# Patient Record
Sex: Female | Born: 1960 | Race: White | Hispanic: No | Marital: Married | Smoking: Former smoker
Health system: Southern US, Community
[De-identification: ages and names within clinical notes are randomized; demographics above are authoritative.]

## PROBLEM LIST (undated history)

## (undated) DIAGNOSIS — M810 Age-related osteoporosis without current pathological fracture: Secondary | ICD-10-CM

## (undated) DIAGNOSIS — E785 Hyperlipidemia, unspecified: Secondary | ICD-10-CM

## (undated) DIAGNOSIS — I447 Left bundle-branch block, unspecified: Secondary | ICD-10-CM

## (undated) HISTORY — DX: Left bundle-branch block, unspecified: I44.7

## (undated) HISTORY — PX: OTHER SURGICAL HISTORY: SHX169

---

## 2001-06-05 ENCOUNTER — Other Ambulatory Visit: Admission: RE | Admit: 2001-06-05 | Discharge: 2001-06-05 | Payer: Self-pay | Admitting: Obstetrics and Gynecology

## 2002-07-15 ENCOUNTER — Other Ambulatory Visit: Admission: RE | Admit: 2002-07-15 | Discharge: 2002-07-15 | Payer: Self-pay | Admitting: Obstetrics and Gynecology

## 2003-08-14 ENCOUNTER — Other Ambulatory Visit: Admission: RE | Admit: 2003-08-14 | Discharge: 2003-08-14 | Payer: Self-pay | Admitting: Obstetrics and Gynecology

## 2004-08-16 ENCOUNTER — Other Ambulatory Visit: Admission: RE | Admit: 2004-08-16 | Discharge: 2004-08-16 | Payer: Self-pay | Admitting: Obstetrics and Gynecology

## 2015-09-29 DIAGNOSIS — R102 Pelvic and perineal pain: Secondary | ICD-10-CM | POA: Diagnosis not present

## 2015-09-29 DIAGNOSIS — N941 Unspecified dyspareunia: Secondary | ICD-10-CM | POA: Diagnosis not present

## 2015-10-11 DIAGNOSIS — Z0000 Encounter for general adult medical examination without abnormal findings: Secondary | ICD-10-CM | POA: Diagnosis not present

## 2015-10-11 DIAGNOSIS — Z8249 Family history of ischemic heart disease and other diseases of the circulatory system: Secondary | ICD-10-CM | POA: Diagnosis not present

## 2015-10-11 DIAGNOSIS — N390 Urinary tract infection, site not specified: Secondary | ICD-10-CM | POA: Diagnosis not present

## 2015-10-11 DIAGNOSIS — F438 Other reactions to severe stress: Secondary | ICD-10-CM | POA: Diagnosis not present

## 2015-10-11 DIAGNOSIS — Z23 Encounter for immunization: Secondary | ICD-10-CM | POA: Diagnosis not present

## 2015-10-11 DIAGNOSIS — Z1389 Encounter for screening for other disorder: Secondary | ICD-10-CM | POA: Diagnosis not present

## 2015-10-11 DIAGNOSIS — N3010 Interstitial cystitis (chronic) without hematuria: Secondary | ICD-10-CM | POA: Diagnosis not present

## 2015-10-11 DIAGNOSIS — N200 Calculus of kidney: Secondary | ICD-10-CM | POA: Diagnosis not present

## 2015-10-11 DIAGNOSIS — R8299 Other abnormal findings in urine: Secondary | ICD-10-CM | POA: Diagnosis not present

## 2016-06-08 DIAGNOSIS — Z6824 Body mass index (BMI) 24.0-24.9, adult: Secondary | ICD-10-CM | POA: Diagnosis not present

## 2016-06-08 DIAGNOSIS — Z01419 Encounter for gynecological examination (general) (routine) without abnormal findings: Secondary | ICD-10-CM | POA: Diagnosis not present

## 2016-07-05 DIAGNOSIS — Z1231 Encounter for screening mammogram for malignant neoplasm of breast: Secondary | ICD-10-CM | POA: Diagnosis not present

## 2016-10-25 DIAGNOSIS — E784 Other hyperlipidemia: Secondary | ICD-10-CM | POA: Diagnosis not present

## 2016-10-31 DIAGNOSIS — Z0000 Encounter for general adult medical examination without abnormal findings: Secondary | ICD-10-CM | POA: Diagnosis not present

## 2016-10-31 DIAGNOSIS — Z1389 Encounter for screening for other disorder: Secondary | ICD-10-CM | POA: Diagnosis not present

## 2016-10-31 DIAGNOSIS — E784 Other hyperlipidemia: Secondary | ICD-10-CM | POA: Diagnosis not present

## 2016-10-31 DIAGNOSIS — F438 Other reactions to severe stress: Secondary | ICD-10-CM | POA: Diagnosis not present

## 2016-10-31 DIAGNOSIS — Z23 Encounter for immunization: Secondary | ICD-10-CM | POA: Diagnosis not present

## 2016-10-31 DIAGNOSIS — Z8249 Family history of ischemic heart disease and other diseases of the circulatory system: Secondary | ICD-10-CM | POA: Diagnosis not present

## 2016-10-31 DIAGNOSIS — N200 Calculus of kidney: Secondary | ICD-10-CM | POA: Diagnosis not present

## 2016-11-01 DIAGNOSIS — Z1212 Encounter for screening for malignant neoplasm of rectum: Secondary | ICD-10-CM | POA: Diagnosis not present

## 2017-04-30 DIAGNOSIS — H524 Presbyopia: Secondary | ICD-10-CM | POA: Diagnosis not present

## 2017-04-30 DIAGNOSIS — H52202 Unspecified astigmatism, left eye: Secondary | ICD-10-CM | POA: Diagnosis not present

## 2017-05-10 DIAGNOSIS — N390 Urinary tract infection, site not specified: Secondary | ICD-10-CM | POA: Diagnosis not present

## 2017-05-10 DIAGNOSIS — R82998 Other abnormal findings in urine: Secondary | ICD-10-CM | POA: Diagnosis not present

## 2017-07-16 DIAGNOSIS — Z1231 Encounter for screening mammogram for malignant neoplasm of breast: Secondary | ICD-10-CM | POA: Diagnosis not present

## 2017-07-16 DIAGNOSIS — Z6824 Body mass index (BMI) 24.0-24.9, adult: Secondary | ICD-10-CM | POA: Diagnosis not present

## 2017-07-16 DIAGNOSIS — Z01419 Encounter for gynecological examination (general) (routine) without abnormal findings: Secondary | ICD-10-CM | POA: Diagnosis not present

## 2017-07-16 DIAGNOSIS — Z23 Encounter for immunization: Secondary | ICD-10-CM | POA: Diagnosis not present

## 2017-08-02 DIAGNOSIS — Z13820 Encounter for screening for osteoporosis: Secondary | ICD-10-CM | POA: Diagnosis not present

## 2017-08-14 DIAGNOSIS — M818 Other osteoporosis without current pathological fracture: Secondary | ICD-10-CM | POA: Diagnosis not present

## 2017-12-05 DIAGNOSIS — Z0000 Encounter for general adult medical examination without abnormal findings: Secondary | ICD-10-CM | POA: Diagnosis not present

## 2017-12-05 DIAGNOSIS — E7849 Other hyperlipidemia: Secondary | ICD-10-CM | POA: Diagnosis not present

## 2017-12-13 DIAGNOSIS — E7849 Other hyperlipidemia: Secondary | ICD-10-CM | POA: Diagnosis not present

## 2017-12-13 DIAGNOSIS — Z8249 Family history of ischemic heart disease and other diseases of the circulatory system: Secondary | ICD-10-CM | POA: Diagnosis not present

## 2017-12-13 DIAGNOSIS — N200 Calculus of kidney: Secondary | ICD-10-CM | POA: Diagnosis not present

## 2017-12-13 DIAGNOSIS — F438 Other reactions to severe stress: Secondary | ICD-10-CM | POA: Diagnosis not present

## 2017-12-13 DIAGNOSIS — Z0000 Encounter for general adult medical examination without abnormal findings: Secondary | ICD-10-CM | POA: Diagnosis not present

## 2017-12-13 DIAGNOSIS — Z1389 Encounter for screening for other disorder: Secondary | ICD-10-CM | POA: Diagnosis not present

## 2017-12-18 DIAGNOSIS — Z1212 Encounter for screening for malignant neoplasm of rectum: Secondary | ICD-10-CM | POA: Diagnosis not present

## 2017-12-19 ENCOUNTER — Ambulatory Visit (INDEPENDENT_AMBULATORY_CARE_PROVIDER_SITE_OTHER): Payer: Self-pay

## 2017-12-19 ENCOUNTER — Encounter (INDEPENDENT_AMBULATORY_CARE_PROVIDER_SITE_OTHER): Payer: Self-pay | Admitting: Orthopaedic Surgery

## 2017-12-19 ENCOUNTER — Ambulatory Visit (INDEPENDENT_AMBULATORY_CARE_PROVIDER_SITE_OTHER): Payer: BLUE CROSS/BLUE SHIELD | Admitting: Orthopaedic Surgery

## 2017-12-19 DIAGNOSIS — M25562 Pain in left knee: Secondary | ICD-10-CM | POA: Diagnosis not present

## 2017-12-19 DIAGNOSIS — G8929 Other chronic pain: Secondary | ICD-10-CM | POA: Diagnosis not present

## 2017-12-19 DIAGNOSIS — M25522 Pain in left elbow: Secondary | ICD-10-CM

## 2017-12-19 NOTE — Progress Notes (Signed)
Office Visit Note   Patient: Monica Adams           Date of Birth: 1961-03-24           MRN: [id number] Visit Date: 12/19/2017              Requested by: Rodrigo Ran, MD [address] [place], [place] [address] PCP: Rodrigo Ran, MD   Assessment & Plan: Visit Diagnoses:  1. Pain in left elbow   2. Chronic pain of left knee     Plan: Impression is left knee chondromalacia patella and crepitus and left elbow catching.  X-rays are reassuring for structural abnormalities.  We discussed avoidance of certain activities.  Questions encouraged.  Follow-up as needed.  Follow-Up Instructions: Return if symptoms worsen or fail to improve.   Orders:  Orders Placed This Encounter  Procedures  . XR Elbow Complete Left (3+View)  . XR KNEE 3 VIEW LEFT   No orders of the defined types were placed in this encounter.     Procedures: No procedures performed   Clinical Data: No additional findings.   Subjective: Chief Complaint  Patient presents with  . Left Elbow - Pain  . Left Knee - Pain    Monica Adams is a healthy 57 year old female comes in with left elbow and left knee complaints.  In terms of her elbow she states that when she is playing tennis and exercising she will occasionally feel locking up in a very brief manner which does not cause any significant pain.  It catches but then it releases.  She denies any injuries.  Denies any numbness and tingling.  Terms of her left knee she states that she has anterior knee pain is worse with squatting and lunging with crepitus.  Denies any injuries.   Review of Systems  Constitutional: Negative.   HENT: Negative.   Eyes: Negative.   Respiratory: Negative.   Cardiovascular: Negative.   Endocrine: Negative.   Musculoskeletal: Negative.   Neurological: Negative.   Hematological: Negative.   Psychiatric/Behavioral: Negative.   All other systems reviewed and are negative.    Objective: Vital Signs: There were no vitals  taken for this visit.  Physical Exam  Constitutional: She is oriented to person, place, and time. She appears well-developed and well-nourished.  HENT:  Head: Normocephalic and atraumatic.  Eyes: EOM are normal.  Neck: Neck supple.  Pulmonary/Chest: Effort normal.  Abdominal: Soft.  Neurological: She is alert and oriented to person, place, and time.  Skin: Skin is warm. Capillary refill takes less than 2 seconds.  Psychiatric: She has a normal mood and affect. Her behavior is normal. Judgment and thought content normal.  Nursing note and vitals reviewed.   Ortho Exam Left elbow exam shows full range of motion and full pronation supination.  There is  very mild discomfort with palpation of the radial head.  Collateral ligaments are intact.  Neurovascular intact.  I am not able to reproduce the symptoms.  Left knee exam is consistent with significant patellofemoral crepitus but this is essentially symmetric to the other side.  Collaterals and cruciates are stable.  No joint effusion. Specialty Comments:  No specialty comments available.  Imaging: Xr Knee 3 View Left  Result Date: 12/19/2017 No acute or structural abnormalities    PMFS History: There are no active problems to display for this patient.  History reviewed. No pertinent past medical history.  History reviewed. No pertinent family history.  History reviewed. No pertinent surgical history. Social History  Occupational History  . Not on file  Tobacco Use  . Smoking status: Not on file  Substance and Sexual Activity  . Alcohol use: Not on file  . Drug use: Not on file  . Sexual activity: Not on file

## 2017-12-26 DIAGNOSIS — Z136 Encounter for screening for cardiovascular disorders: Secondary | ICD-10-CM | POA: Diagnosis not present

## 2017-12-26 DIAGNOSIS — Z0189 Encounter for other specified special examinations: Secondary | ICD-10-CM | POA: Diagnosis not present

## 2017-12-26 DIAGNOSIS — Z8249 Family history of ischemic heart disease and other diseases of the circulatory system: Secondary | ICD-10-CM | POA: Diagnosis not present

## 2018-01-23 DIAGNOSIS — H0014 Chalazion left upper eyelid: Secondary | ICD-10-CM | POA: Diagnosis not present

## 2018-02-08 DIAGNOSIS — Z8249 Family history of ischemic heart disease and other diseases of the circulatory system: Secondary | ICD-10-CM | POA: Diagnosis not present

## 2018-02-08 DIAGNOSIS — Z0189 Encounter for other specified special examinations: Secondary | ICD-10-CM | POA: Diagnosis not present

## 2018-02-08 DIAGNOSIS — Z136 Encounter for screening for cardiovascular disorders: Secondary | ICD-10-CM | POA: Diagnosis not present

## 2018-02-08 DIAGNOSIS — E782 Mixed hyperlipidemia: Secondary | ICD-10-CM | POA: Diagnosis not present

## 2018-03-04 DIAGNOSIS — Z136 Encounter for screening for cardiovascular disorders: Secondary | ICD-10-CM | POA: Diagnosis not present

## 2018-04-12 DIAGNOSIS — Z23 Encounter for immunization: Secondary | ICD-10-CM | POA: Diagnosis not present

## 2018-07-29 DIAGNOSIS — Z01419 Encounter for gynecological examination (general) (routine) without abnormal findings: Secondary | ICD-10-CM | POA: Diagnosis not present

## 2018-07-29 DIAGNOSIS — Z1231 Encounter for screening mammogram for malignant neoplasm of breast: Secondary | ICD-10-CM | POA: Diagnosis not present

## 2018-07-29 DIAGNOSIS — Z6824 Body mass index (BMI) 24.0-24.9, adult: Secondary | ICD-10-CM | POA: Diagnosis not present

## 2018-08-07 ENCOUNTER — Ambulatory Visit (INDEPENDENT_AMBULATORY_CARE_PROVIDER_SITE_OTHER): Payer: Self-pay

## 2018-08-07 ENCOUNTER — Other Ambulatory Visit: Payer: Self-pay | Admitting: Podiatry

## 2018-08-07 ENCOUNTER — Encounter: Payer: Self-pay | Admitting: Podiatry

## 2018-08-07 ENCOUNTER — Ambulatory Visit (INDEPENDENT_AMBULATORY_CARE_PROVIDER_SITE_OTHER): Payer: Self-pay | Admitting: Podiatry

## 2018-08-07 VITALS — BP 133/83 | HR 60

## 2018-08-07 DIAGNOSIS — M779 Enthesopathy, unspecified: Secondary | ICD-10-CM

## 2018-08-07 DIAGNOSIS — M79671 Pain in right foot: Secondary | ICD-10-CM

## 2018-08-07 MED ORDER — TRIAMCINOLONE ACETONIDE 10 MG/ML IJ SUSP
10.0000 mg | Freq: Once | INTRAMUSCULAR | Status: AC
  Administered 2018-08-07: 10 mg

## 2018-08-07 MED ORDER — DICLOFENAC SODIUM 75 MG PO TBEC: 75.0000 mg | DELAYED_RELEASE_TABLET | Freq: Two times a day (BID) | ORAL | 2 refills | Status: DC

## 2018-08-07 NOTE — Progress Notes (Signed)
Subjective:   Patient ID: Monica Adams Doctor, female   DOB: 58 y.o.   MRN: [id number]   HPI Patient states she developed intense discomfort in the right foot several weeks ago when she walked a lot of miles in a very flat shoe and her foot is been bothering her ever since and it was approximately 3 weeks ago.  Patient does not smoke and likes to be active   Review of Systems  All other systems reviewed and are negative.       Objective:  Physical Exam Vitals signs and nursing note reviewed.  Constitutional:      Appearance: She is well-developed.  Pulmonary:     Effort: Pulmonary effort is normal.  Musculoskeletal: Normal range of motion.  Skin:    General: Skin is warm.  Neurological:     Mental Status: She is alert.     Neurovascular status intact muscle strength is adequate range of motion within normal limits with exquisite discomfort third MPJ right with inflammation fluid of the joint surface and pain with palpation.  Patient is noted to have good digital perfusion and is well oriented x3     Assessment:  Acute capsulitis third MPJ right with inflammation fluid of the joint with slight possibility for stress fracture     Plan:  H&P education x-ray rendered and discussed with patient.  At this point I would have treated conservatively and I did a proximal nerve block of the area I did sterile prep I aspirated the joint getting out a small amount of clear fluid and injected quarter cc dexamethasone Kenalog applied padding advised on rigid bottom shoes and reappoint to recheck 2 weeks and placed on diclofenac 75 mg twice daily  X-ray indicates no current indication of stress fracture or other pathology

## 2018-08-21 DIAGNOSIS — D224 Melanocytic nevi of scalp and neck: Secondary | ICD-10-CM | POA: Diagnosis not present

## 2018-08-21 DIAGNOSIS — D2261 Melanocytic nevi of right upper limb, including shoulder: Secondary | ICD-10-CM | POA: Diagnosis not present

## 2018-08-21 DIAGNOSIS — D0461 Carcinoma in situ of skin of right upper limb, including shoulder: Secondary | ICD-10-CM | POA: Diagnosis not present

## 2018-08-21 DIAGNOSIS — L821 Other seborrheic keratosis: Secondary | ICD-10-CM | POA: Diagnosis not present

## 2018-08-21 DIAGNOSIS — D2262 Melanocytic nevi of left upper limb, including shoulder: Secondary | ICD-10-CM | POA: Diagnosis not present

## 2018-08-22 ENCOUNTER — Ambulatory Visit: Payer: Self-pay | Admitting: Podiatry

## 2018-08-23 ENCOUNTER — Ambulatory Visit (INDEPENDENT_AMBULATORY_CARE_PROVIDER_SITE_OTHER): Payer: BLUE CROSS/BLUE SHIELD | Admitting: Podiatry

## 2018-08-23 ENCOUNTER — Encounter: Payer: Self-pay | Admitting: Podiatry

## 2018-08-23 DIAGNOSIS — M779 Enthesopathy, unspecified: Secondary | ICD-10-CM

## 2018-08-23 DIAGNOSIS — M79671 Pain in right foot: Secondary | ICD-10-CM | POA: Diagnosis not present

## 2018-08-24 NOTE — Progress Notes (Signed)
Subjective:   Patient ID: Monica Adams, female   DOB: 58 y.o.   MRN: [id number]   HPI Patient states she is doing much better after the medication we did a couple weeks ago and states her pain has receded quite significantly   ROS      Objective:  Physical Exam  Neurovascular status intact with patient's right third MPJ doing much better with pain still present only upon deep palpation but nowhere near as sore as previous     Assessment:  Inflammatory capsulitis improving right     Plan:  Advised on shoe gear modification and padding and discussed ultimately may require orthotics if symptoms recur

## 2018-10-16 ENCOUNTER — Encounter: Payer: Self-pay | Admitting: Podiatry

## 2018-10-16 ENCOUNTER — Ambulatory Visit: Payer: BLUE CROSS/BLUE SHIELD | Admitting: Podiatry

## 2018-10-16 ENCOUNTER — Other Ambulatory Visit: Payer: Self-pay

## 2018-10-16 ENCOUNTER — Ambulatory Visit (INDEPENDENT_AMBULATORY_CARE_PROVIDER_SITE_OTHER): Payer: BLUE CROSS/BLUE SHIELD

## 2018-10-16 VITALS — Temp 97.2°F

## 2018-10-16 DIAGNOSIS — M779 Enthesopathy, unspecified: Secondary | ICD-10-CM

## 2018-10-16 DIAGNOSIS — M7751 Other enthesopathy of right foot: Secondary | ICD-10-CM

## 2018-10-16 MED ORDER — TRIAMCINOLONE ACETONIDE 10 MG/ML IJ SUSP
10.0000 mg | Freq: Once | INTRAMUSCULAR | Status: AC
  Administered 2018-10-16: 10 mg

## 2018-10-16 MED ORDER — MELOXICAM 15 MG PO TABS: 15.0000 mg | ORAL_TABLET | Freq: Every day | ORAL | 2 refills | Status: DC

## 2018-10-16 NOTE — Progress Notes (Signed)
Subjective:   Patient ID: Monica Adams, female   DOB: 58 y.o.   MRN: [id number]   HPI Patient presents stating that the right foot has experienced a flareup again it seems to be 2 joints this time and she is noted some swelling on top of the foot   ROS      Objective:  Physical Exam  Neurovascular status intact with patient's third MPJ the most tender second MPJ tender with thickness of the joint with palpation with minimal discomfort of the metatarsal shaft or neck bilateral     Assessment:  Probability for capsulitis second and third MPJs which is most likely due to local conditions but cannot rule out systemic inflammatory condition or other trauma to the structure     Plan:  H&P conditions reviewed and at this point I recommended aspiration of the joints to be followed by air fracture walker usage to try to immobilize the joint surfaces with reoccurrence that if it were to occur I would recommend MRI and blood work.  Today I did a proximal nerve block of 60 mg like Marcaine mixture sterile prep applied to the right forefoot and aspirated the second and third MPJs getting out clear fluid and injected quarter cc dexamethasone Kenalog into each joint and applied thick plantar padding and air fracture walker to immobilize and reduce all pressure against the joint surfaces  X-ray indicates that there is no indications of stress fracture or other arthritic pathology.  May require MRI

## 2018-11-13 ENCOUNTER — Ambulatory Visit: Payer: BLUE CROSS/BLUE SHIELD | Admitting: Podiatry

## 2018-11-20 ENCOUNTER — Other Ambulatory Visit: Payer: Self-pay

## 2018-11-20 ENCOUNTER — Ambulatory Visit: Payer: BLUE CROSS/BLUE SHIELD | Admitting: Podiatry

## 2018-11-20 ENCOUNTER — Encounter: Payer: Self-pay | Admitting: Podiatry

## 2018-11-20 VITALS — Temp 97.7°F

## 2018-11-20 DIAGNOSIS — M79671 Pain in right foot: Secondary | ICD-10-CM | POA: Diagnosis not present

## 2018-11-20 DIAGNOSIS — M779 Enthesopathy, unspecified: Secondary | ICD-10-CM | POA: Diagnosis not present

## 2018-11-20 NOTE — Progress Notes (Signed)
Subjective:   Patient ID: Monica Adams, female   DOB: 58 y.o.   MRN: [id number]   HPI Patient states she is quite a bit improved and does have pain but it is nowhere near to the degree as it was and she is gone without her boot now   ROS      Objective:  Physical Exam  Neurovascular status intact with significant diminishment of discomfort in the second and third MPJs right with boot that is been helping her with mild discomfort upon deep palpation     Assessment:  Improved capsulitis right second and third MPJ     Plan:  H&P discussed condition recommended continuation of conservative care and rigid bottom shoes.  Reviewed condition and discussed possibility for surgical intervention at one point in future

## 2018-12-31 ENCOUNTER — Ambulatory Visit: Payer: Self-pay | Admitting: Cardiology

## 2019-01-22 DIAGNOSIS — M810 Age-related osteoporosis without current pathological fracture: Secondary | ICD-10-CM | POA: Diagnosis not present

## 2019-01-22 DIAGNOSIS — Z0000 Encounter for general adult medical examination without abnormal findings: Secondary | ICD-10-CM | POA: Diagnosis not present

## 2019-01-22 DIAGNOSIS — E7849 Other hyperlipidemia: Secondary | ICD-10-CM | POA: Diagnosis not present

## 2019-01-28 DIAGNOSIS — Z13820 Encounter for screening for osteoporosis: Secondary | ICD-10-CM | POA: Diagnosis not present

## 2019-01-28 DIAGNOSIS — Z0000 Encounter for general adult medical examination without abnormal findings: Secondary | ICD-10-CM | POA: Diagnosis not present

## 2019-01-28 DIAGNOSIS — F439 Reaction to severe stress, unspecified: Secondary | ICD-10-CM | POA: Diagnosis not present

## 2019-01-28 DIAGNOSIS — Z1331 Encounter for screening for depression: Secondary | ICD-10-CM | POA: Diagnosis not present

## 2019-01-28 DIAGNOSIS — E785 Hyperlipidemia, unspecified: Secondary | ICD-10-CM | POA: Diagnosis not present

## 2019-01-28 DIAGNOSIS — Z8249 Family history of ischemic heart disease and other diseases of the circulatory system: Secondary | ICD-10-CM | POA: Diagnosis not present

## 2019-01-28 DIAGNOSIS — M810 Age-related osteoporosis without current pathological fracture: Secondary | ICD-10-CM | POA: Diagnosis not present

## 2019-01-28 DIAGNOSIS — N200 Calculus of kidney: Secondary | ICD-10-CM | POA: Diagnosis not present

## 2019-01-29 ENCOUNTER — Other Ambulatory Visit: Payer: Self-pay

## 2019-01-29 DIAGNOSIS — Z20822 Contact with and (suspected) exposure to covid-19: Secondary | ICD-10-CM

## 2019-01-30 LAB — NOVEL CORONAVIRUS, NAA: SARS-CoV-2, NAA: NOT DETECTED

## 2019-02-04 ENCOUNTER — Other Ambulatory Visit: Payer: Self-pay

## 2019-02-04 DIAGNOSIS — Z20822 Contact with and (suspected) exposure to covid-19: Secondary | ICD-10-CM

## 2019-02-06 LAB — NOVEL CORONAVIRUS, NAA: SARS-CoV-2, NAA: NOT DETECTED

## 2019-02-26 DIAGNOSIS — M810 Age-related osteoporosis without current pathological fracture: Secondary | ICD-10-CM | POA: Diagnosis not present

## 2019-02-26 DIAGNOSIS — R82998 Other abnormal findings in urine: Secondary | ICD-10-CM | POA: Diagnosis not present

## 2019-03-17 DIAGNOSIS — Z23 Encounter for immunization: Secondary | ICD-10-CM | POA: Diagnosis not present

## 2019-04-14 DIAGNOSIS — M7712 Lateral epicondylitis, left elbow: Secondary | ICD-10-CM | POA: Diagnosis not present

## 2019-04-14 DIAGNOSIS — M25522 Pain in left elbow: Secondary | ICD-10-CM | POA: Diagnosis not present

## 2019-04-14 DIAGNOSIS — M7918 Myalgia, other site: Secondary | ICD-10-CM | POA: Diagnosis not present

## 2019-04-14 DIAGNOSIS — M25622 Stiffness of left elbow, not elsewhere classified: Secondary | ICD-10-CM | POA: Diagnosis not present

## 2019-04-17 DIAGNOSIS — M7712 Lateral epicondylitis, left elbow: Secondary | ICD-10-CM | POA: Diagnosis not present

## 2019-04-17 DIAGNOSIS — M25622 Stiffness of left elbow, not elsewhere classified: Secondary | ICD-10-CM | POA: Diagnosis not present

## 2019-04-17 DIAGNOSIS — M7918 Myalgia, other site: Secondary | ICD-10-CM | POA: Diagnosis not present

## 2019-04-17 DIAGNOSIS — M25522 Pain in left elbow: Secondary | ICD-10-CM | POA: Diagnosis not present

## 2019-05-01 DIAGNOSIS — M810 Age-related osteoporosis without current pathological fracture: Secondary | ICD-10-CM | POA: Diagnosis not present

## 2019-05-01 DIAGNOSIS — E7849 Other hyperlipidemia: Secondary | ICD-10-CM | POA: Diagnosis not present

## 2019-08-13 DIAGNOSIS — Z6825 Body mass index (BMI) 25.0-25.9, adult: Secondary | ICD-10-CM | POA: Diagnosis not present

## 2019-08-13 DIAGNOSIS — Z1231 Encounter for screening mammogram for malignant neoplasm of breast: Secondary | ICD-10-CM | POA: Diagnosis not present

## 2019-08-13 DIAGNOSIS — Z01419 Encounter for gynecological examination (general) (routine) without abnormal findings: Secondary | ICD-10-CM | POA: Diagnosis not present

## 2019-08-21 DIAGNOSIS — H938X2 Other specified disorders of left ear: Secondary | ICD-10-CM | POA: Diagnosis not present

## 2019-08-21 DIAGNOSIS — Z87891 Personal history of nicotine dependence: Secondary | ICD-10-CM | POA: Diagnosis not present

## 2019-09-03 DIAGNOSIS — D2262 Melanocytic nevi of left upper limb, including shoulder: Secondary | ICD-10-CM | POA: Diagnosis not present

## 2019-09-03 DIAGNOSIS — L814 Other melanin hyperpigmentation: Secondary | ICD-10-CM | POA: Diagnosis not present

## 2019-09-03 DIAGNOSIS — L570 Actinic keratosis: Secondary | ICD-10-CM | POA: Diagnosis not present

## 2019-09-03 DIAGNOSIS — D225 Melanocytic nevi of trunk: Secondary | ICD-10-CM | POA: Diagnosis not present

## 2019-10-18 DIAGNOSIS — N3001 Acute cystitis with hematuria: Secondary | ICD-10-CM | POA: Diagnosis not present

## 2019-10-18 DIAGNOSIS — R300 Dysuria: Secondary | ICD-10-CM | POA: Diagnosis not present

## 2019-10-22 DIAGNOSIS — H0011 Chalazion right upper eyelid: Secondary | ICD-10-CM | POA: Diagnosis not present

## 2020-01-07 DIAGNOSIS — H5203 Hypermetropia, bilateral: Secondary | ICD-10-CM | POA: Diagnosis not present

## 2020-01-07 DIAGNOSIS — H1789 Other corneal scars and opacities: Secondary | ICD-10-CM | POA: Diagnosis not present

## 2020-01-07 DIAGNOSIS — H00012 Hordeolum externum right lower eyelid: Secondary | ICD-10-CM | POA: Diagnosis not present

## 2020-02-26 DIAGNOSIS — E785 Hyperlipidemia, unspecified: Secondary | ICD-10-CM | POA: Diagnosis not present

## 2020-02-26 DIAGNOSIS — M810 Age-related osteoporosis without current pathological fracture: Secondary | ICD-10-CM | POA: Diagnosis not present

## 2020-03-04 DIAGNOSIS — R3121 Asymptomatic microscopic hematuria: Secondary | ICD-10-CM | POA: Diagnosis not present

## 2020-03-04 DIAGNOSIS — R82998 Other abnormal findings in urine: Secondary | ICD-10-CM | POA: Diagnosis not present

## 2020-03-04 DIAGNOSIS — E785 Hyperlipidemia, unspecified: Secondary | ICD-10-CM | POA: Diagnosis not present

## 2020-03-04 DIAGNOSIS — Z0000 Encounter for general adult medical examination without abnormal findings: Secondary | ICD-10-CM | POA: Diagnosis not present

## 2020-03-19 DIAGNOSIS — Z1212 Encounter for screening for malignant neoplasm of rectum: Secondary | ICD-10-CM | POA: Diagnosis not present

## 2020-04-02 DIAGNOSIS — Z23 Encounter for immunization: Secondary | ICD-10-CM | POA: Diagnosis not present

## 2020-04-06 DIAGNOSIS — K642 Third degree hemorrhoids: Secondary | ICD-10-CM | POA: Diagnosis not present

## 2020-05-04 DIAGNOSIS — K642 Third degree hemorrhoids: Secondary | ICD-10-CM | POA: Diagnosis not present

## 2020-05-10 ENCOUNTER — Ambulatory Visit: Payer: BC Managed Care – PPO | Admitting: Podiatry

## 2020-06-07 DIAGNOSIS — K642 Third degree hemorrhoids: Secondary | ICD-10-CM | POA: Diagnosis not present

## 2020-09-07 DIAGNOSIS — D2262 Melanocytic nevi of left upper limb, including shoulder: Secondary | ICD-10-CM | POA: Diagnosis not present

## 2020-09-07 DIAGNOSIS — D2261 Melanocytic nevi of right upper limb, including shoulder: Secondary | ICD-10-CM | POA: Diagnosis not present

## 2020-09-07 DIAGNOSIS — D2271 Melanocytic nevi of right lower limb, including hip: Secondary | ICD-10-CM | POA: Diagnosis not present

## 2020-09-07 DIAGNOSIS — D225 Melanocytic nevi of trunk: Secondary | ICD-10-CM | POA: Diagnosis not present

## 2020-09-13 DIAGNOSIS — Z6824 Body mass index (BMI) 24.0-24.9, adult: Secondary | ICD-10-CM | POA: Diagnosis not present

## 2020-09-13 DIAGNOSIS — Z01419 Encounter for gynecological examination (general) (routine) without abnormal findings: Secondary | ICD-10-CM | POA: Diagnosis not present

## 2020-10-18 DIAGNOSIS — Z1231 Encounter for screening mammogram for malignant neoplasm of breast: Secondary | ICD-10-CM | POA: Diagnosis not present

## 2021-03-07 DIAGNOSIS — Z13820 Encounter for screening for osteoporosis: Secondary | ICD-10-CM | POA: Diagnosis not present

## 2021-03-07 DIAGNOSIS — Z8742 Personal history of other diseases of the female genital tract: Secondary | ICD-10-CM | POA: Diagnosis not present

## 2021-03-07 DIAGNOSIS — R87619 Unspecified abnormal cytological findings in specimens from cervix uteri: Secondary | ICD-10-CM | POA: Diagnosis not present

## 2021-03-07 DIAGNOSIS — M810 Age-related osteoporosis without current pathological fracture: Secondary | ICD-10-CM | POA: Diagnosis not present

## 2021-03-07 DIAGNOSIS — R87610 Atypical squamous cells of undetermined significance on cytologic smear of cervix (ASC-US): Secondary | ICD-10-CM | POA: Diagnosis not present

## 2021-04-05 DIAGNOSIS — E785 Hyperlipidemia, unspecified: Secondary | ICD-10-CM | POA: Diagnosis not present

## 2021-04-05 DIAGNOSIS — M810 Age-related osteoporosis without current pathological fracture: Secondary | ICD-10-CM | POA: Diagnosis not present

## 2021-04-10 DIAGNOSIS — N390 Urinary tract infection, site not specified: Secondary | ICD-10-CM | POA: Diagnosis not present

## 2021-04-11 DIAGNOSIS — Z23 Encounter for immunization: Secondary | ICD-10-CM | POA: Diagnosis not present

## 2021-04-12 DIAGNOSIS — Z1331 Encounter for screening for depression: Secondary | ICD-10-CM | POA: Diagnosis not present

## 2021-04-12 DIAGNOSIS — I2510 Atherosclerotic heart disease of native coronary artery without angina pectoris: Secondary | ICD-10-CM | POA: Diagnosis not present

## 2021-04-12 DIAGNOSIS — Z1212 Encounter for screening for malignant neoplasm of rectum: Secondary | ICD-10-CM | POA: Diagnosis not present

## 2021-04-12 DIAGNOSIS — Z0000 Encounter for general adult medical examination without abnormal findings: Secondary | ICD-10-CM | POA: Diagnosis not present

## 2021-04-12 DIAGNOSIS — Z1389 Encounter for screening for other disorder: Secondary | ICD-10-CM | POA: Diagnosis not present

## 2021-04-12 DIAGNOSIS — R82998 Other abnormal findings in urine: Secondary | ICD-10-CM | POA: Diagnosis not present

## 2021-05-10 ENCOUNTER — Ambulatory Visit (INDEPENDENT_AMBULATORY_CARE_PROVIDER_SITE_OTHER): Payer: BC Managed Care – PPO

## 2021-05-10 ENCOUNTER — Other Ambulatory Visit: Payer: Self-pay

## 2021-05-10 ENCOUNTER — Ambulatory Visit (INDEPENDENT_AMBULATORY_CARE_PROVIDER_SITE_OTHER): Payer: BC Managed Care – PPO | Admitting: Podiatry

## 2021-05-10 ENCOUNTER — Encounter: Payer: Self-pay | Admitting: Podiatry

## 2021-05-10 DIAGNOSIS — M778 Other enthesopathies, not elsewhere classified: Secondary | ICD-10-CM | POA: Diagnosis not present

## 2021-05-10 DIAGNOSIS — M8580 Other specified disorders of bone density and structure, unspecified site: Secondary | ICD-10-CM | POA: Insufficient documentation

## 2021-05-10 DIAGNOSIS — E785 Hyperlipidemia, unspecified: Secondary | ICD-10-CM | POA: Insufficient documentation

## 2021-05-10 DIAGNOSIS — M810 Age-related osteoporosis without current pathological fracture: Secondary | ICD-10-CM | POA: Insufficient documentation

## 2021-05-10 DIAGNOSIS — M19071 Primary osteoarthritis, right ankle and foot: Secondary | ICD-10-CM | POA: Diagnosis not present

## 2021-05-10 MED ORDER — MELOXICAM 15 MG PO TABS: 15.0000 mg | ORAL_TABLET | Freq: Every day | ORAL | 3 refills | Status: DC

## 2021-05-10 NOTE — Progress Notes (Signed)
She presents returning today after having not seen Dr. Dellia Nims for a year with a similar complaint of the second and third metatarsal phalangeal joint area.  States that is starting to affect her ability to wear regular shoe gear and is becoming more painful as time goes on.  She states that she cannot stay active and maintain good health with this painful right foot.  Objective: Vital signs are stable alert and oriented x3.  Pulses are strongly palpable.  Neurologic sensorium is intact Deetjen reflexes are intact muscle strength is normal symmetrical.  Orthopedic evaluation demonstrates painful limitation on range of motion of the third metatarsal phalangeal joint particular dorsiflexion.  Radiographs taken today demonstrate joint space narrowing subchondral sclerosis and some cystic deformity.  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  Possible plantar plate tear or rupture.  Plan: Discussed etiology pathology conservative surgical therapies at this point I am requesting MRI for surgical consideration and reconstruction as well as differential diagnosis.

## 2021-07-03 ENCOUNTER — Other Ambulatory Visit: Payer: Self-pay

## 2021-07-03 ENCOUNTER — Ambulatory Visit
Admission: RE | Admit: 2021-07-03 | Discharge: 2021-07-03 | Disposition: A | Discharge status: Home / Self Care | Payer: BC Managed Care – PPO | Source: Ambulatory Visit | Attending: Podiatry | Admitting: Podiatry

## 2021-07-03 DIAGNOSIS — R600 Localized edema: Secondary | ICD-10-CM | POA: Diagnosis not present

## 2021-07-03 DIAGNOSIS — M778 Other enthesopathies, not elsewhere classified: Secondary | ICD-10-CM

## 2021-07-03 DIAGNOSIS — M19071 Primary osteoarthritis, right ankle and foot: Secondary | ICD-10-CM | POA: Diagnosis not present

## 2021-07-12 ENCOUNTER — Ambulatory Visit (INDEPENDENT_AMBULATORY_CARE_PROVIDER_SITE_OTHER): Payer: BC Managed Care – PPO | Admitting: Podiatry

## 2021-07-12 ENCOUNTER — Other Ambulatory Visit: Payer: Self-pay

## 2021-07-12 ENCOUNTER — Encounter: Payer: Self-pay | Admitting: Podiatry

## 2021-07-12 DIAGNOSIS — M19071 Primary osteoarthritis, right ankle and foot: Secondary | ICD-10-CM

## 2021-07-12 NOTE — Progress Notes (Signed)
She presents today to discuss her MRI and to discuss surgical intervention.  Objective: Vital signs are stable she is alert and oriented x3 have reviewed her past medical history medications allergies surgery social history all with no changes.  Pulses are strongly palpable neurologic sensorium is intact Deetjen reflexes are intact muscle strength is normal and symmetrical.  She still has pain on palpation within range of motion of the third metatarsal phalangeal joint.  MRI does demonstrate 5 Berkson fraction with bony edema and a fracture to the subchondral bone of that third metatarsal head.  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  Plan: Discussed etiology pathology conservative versus surgical therapies.  At this point I highly recommended surgical intervention.  We discussed the pros and cons of single silicone implants for the lesser metatarsophalangeal joints hemiarthroplasties with titanium he had replacements and soft tissue interposition arthroplasty.  At this point we consented her for a soft tissue interpositional arthroplasty of the third metatarsal phalangeal joint of the right foot.  This was explained to her with her in great detail today she understands and is amendable to it.  She understands that she will have a K wire through the toe for 4 to 6 weeks and that she will be in a cam walker for up to 4 weeks and then Darco shoe.  We did discuss the possible postop complications which may include but are not limited to postop pain bleeding swelling infection recurrence need further surgery overcorrection under correction also digit loss of limb loss of life.  She already has a cam walker she will bring it with her on the day of surgery.

## 2021-08-11 ENCOUNTER — Telehealth: Payer: Self-pay

## 2021-08-11 NOTE — Telephone Encounter (Signed)
Monica Adams called to cancel her surgery with Dr. Al Corpus on 11/25/2021. 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 She stated she will call back when she is ready to reschedule. Notified Dr. Al Corpus and Aram Beecham with [facility]

## 2021-09-09 DIAGNOSIS — D2262 Melanocytic nevi of left upper limb, including shoulder: Secondary | ICD-10-CM | POA: Diagnosis not present

## 2021-09-09 DIAGNOSIS — L814 Other melanin hyperpigmentation: Secondary | ICD-10-CM | POA: Diagnosis not present

## 2021-09-09 DIAGNOSIS — L738 Other specified follicular disorders: Secondary | ICD-10-CM | POA: Diagnosis not present

## 2021-09-09 DIAGNOSIS — L570 Actinic keratosis: Secondary | ICD-10-CM | POA: Diagnosis not present

## 2021-09-09 DIAGNOSIS — L820 Inflamed seborrheic keratosis: Secondary | ICD-10-CM | POA: Diagnosis not present

## 2021-10-24 DIAGNOSIS — H1789 Other corneal scars and opacities: Secondary | ICD-10-CM | POA: Diagnosis not present

## 2021-10-24 DIAGNOSIS — H5203 Hypermetropia, bilateral: Secondary | ICD-10-CM | POA: Diagnosis not present

## 2021-10-31 DIAGNOSIS — Z124 Encounter for screening for malignant neoplasm of cervix: Secondary | ICD-10-CM | POA: Diagnosis not present

## 2021-10-31 DIAGNOSIS — Z1151 Encounter for screening for human papillomavirus (HPV): Secondary | ICD-10-CM | POA: Diagnosis not present

## 2021-10-31 DIAGNOSIS — Z1231 Encounter for screening mammogram for malignant neoplasm of breast: Secondary | ICD-10-CM | POA: Diagnosis not present

## 2021-10-31 DIAGNOSIS — Z01419 Encounter for gynecological examination (general) (routine) without abnormal findings: Secondary | ICD-10-CM | POA: Diagnosis not present

## 2021-10-31 DIAGNOSIS — Z6825 Body mass index (BMI) 25.0-25.9, adult: Secondary | ICD-10-CM | POA: Diagnosis not present

## 2021-11-08 ENCOUNTER — Emergency Department (HOSPITAL_BASED_OUTPATIENT_CLINIC_OR_DEPARTMENT_OTHER)
Admission: EM | Admit: 2021-11-08 | Discharge: 2021-11-08 | Disposition: A | Discharge status: Home / Self Care | Payer: BC Managed Care – PPO | Attending: Emergency Medicine | Admitting: Emergency Medicine

## 2021-11-08 ENCOUNTER — Encounter (HOSPITAL_BASED_OUTPATIENT_CLINIC_OR_DEPARTMENT_OTHER): Payer: Self-pay

## 2021-11-08 ENCOUNTER — Other Ambulatory Visit: Payer: Self-pay

## 2021-11-08 ENCOUNTER — Emergency Department (HOSPITAL_BASED_OUTPATIENT_CLINIC_OR_DEPARTMENT_OTHER): Payer: BC Managed Care – PPO

## 2021-11-08 DIAGNOSIS — R0789 Other chest pain: Secondary | ICD-10-CM | POA: Diagnosis not present

## 2021-11-08 DIAGNOSIS — R079 Chest pain, unspecified: Secondary | ICD-10-CM

## 2021-11-08 DIAGNOSIS — R61 Generalized hyperhidrosis: Secondary | ICD-10-CM | POA: Insufficient documentation

## 2021-11-08 DIAGNOSIS — I10 Essential (primary) hypertension: Secondary | ICD-10-CM | POA: Insufficient documentation

## 2021-11-08 DIAGNOSIS — Z79899 Other long term (current) drug therapy: Secondary | ICD-10-CM | POA: Diagnosis not present

## 2021-11-08 HISTORY — DX: Hyperlipidemia, unspecified: E78.5

## 2021-11-08 HISTORY — DX: Age-related osteoporosis without current pathological fracture: M81.0

## 2021-11-08 LAB — CBC
HCT: 41.2 % (ref 36.0–46.0)
Hemoglobin: 14 g/dL (ref 12.0–15.0)
MCH: 31.3 pg (ref 26.0–34.0)
MCHC: 34 g/dL (ref 30.0–36.0)
MCV: 92.2 fL (ref 80.0–100.0)
Platelets: 245 10*3/uL (ref 150–400)
RBC: 4.47 MIL/uL (ref 3.87–5.11)
RDW: 12.4 % (ref 11.5–15.5)
WBC: 4.8 10*3/uL (ref 4.0–10.5)
nRBC: 0 % (ref 0.0–0.2)

## 2021-11-08 LAB — BASIC METABOLIC PANEL
Anion gap: 5 (ref 5–15)
BUN: 19 mg/dL (ref 6–20)
CO2: 24 mmol/L (ref 22–32)
Calcium: 9.2 mg/dL (ref 8.9–10.3)
Chloride: 111 mmol/L (ref 98–111)
Creatinine, Ser: 0.71 mg/dL (ref 0.44–1.00)
GFR, Estimated: >60 mL/min (ref >60)
Glucose, Bld: 102 mg/dL — ABNORMAL HIGH (ref 70–99)
Potassium: 4.3 mmol/L (ref 3.5–5.1)
Sodium: 140 mmol/L (ref 135–145)

## 2021-11-08 LAB — TROPONIN I (HIGH SENSITIVITY)
Troponin I (High Sensitivity): 4 ng/L (ref <18)
Troponin I (High Sensitivity): 5 ng/L (ref <18)

## 2021-11-08 MED ORDER — NITROGLYCERIN 0.4 MG SL SUBL: 0.4000 mg | SUBLINGUAL_TABLET | SUBLINGUAL | 0 refills | Status: AC | PRN

## 2021-11-08 MED ORDER — NITROGLYCERIN 0.4 MG SL SUBL
0.4000 mg | SUBLINGUAL_TABLET | Freq: Once | SUBLINGUAL | Status: AC
  Administered 2021-11-08: 0.4 mg via SUBLINGUAL
  Filled 2021-11-08: qty 1

## 2021-11-08 NOTE — Discharge Instructions (Signed)
Your work-up today was overall reassuring, however we did discuss possible admission to the hospital for further cardiac work-up.  However as discussed, you have opted to follow-up with your cardiologist.  Please call them today and schedule an appointment.   Take the nitro as needed.  Please return to the ER for any worsening chest pain, dizziness, nausea, or any other new or concerning symptoms. ?

## 2021-11-08 NOTE — ED Notes (Signed)
Cont to c/o "pull muscle" type pain at under left breast with radiation to left shoulder blade area, NSR cont to be noted on monitor ?

## 2021-11-08 NOTE — ED Notes (Signed)
ED Provider at bedside. 

## 2021-11-08 NOTE — ED Triage Notes (Signed)
Pt reports L sided CP radiating to her back described as "tightness". Pt denies SOB, N/V, endorses diaphoresis when pain started.  ?

## 2021-11-08 NOTE — ED Notes (Signed)
Prior to SL NTG administration, pt rated pain a 6 on 0-10 scale, post SL NTG pain is rated at 3 on 0-10 scale, ED MD informed ?

## 2021-11-08 NOTE — ED Provider Notes (Signed)
?[facility] EMERGENCY DEPARTMENT ?Provider Note ? ? ?CSN: [id number] ?Arrival date & time: 11/08/21  0907 ? ?  ? ?History ? ?Chief Complaint  ?Patient presents with  ? Chest Pain  ? ? ?Monica Adams is a 61 y.o. female. ? ?HPI ?61 year old female with a history of hyperlipidemia and osteoporosis presents to the ER with complaints of chest tightness.  Patient states that she went on a walk this morning and  as she started to get ready for work , she started to feel some left-sided chest tightness radiating and into the back.  The pain has been constant.  She does state that she has had some increasing stressors at home.  She denies any dizziness, N/V but did state she felt diaphoretic. She has had lot of stressors going on at home. Denies any shortness of breath.  No syncope.  No cough, fevers or chills.  No aggravating or alleviating factors.  No recent travel, no known exogenous  estrogen. No recent surgeries.  Patient reports a significant family cardiac history including her mother who had a MI in her 51s.  She states that she has had several episodes like this in the last week but this episode has been by far the worst. ?  ? ?Home Medications ?Prior to Admission medications   ?Medication Sig Start Date End Date Taking? Authorizing Provider  ?nitroGLYCERIN (NITROSTAT) 0.4 MG SL tablet Place 1 tablet (0.4 mg total) under the tongue every 5 (five) minutes as needed for up to 4 doses for chest pain. 11/08/21  Yes Mare Ferrari, PA-C  ?diclofenac (VOLTAREN) 75 MG EC tablet Take 1 tablet (75 mg total) by mouth 2 (two) times daily. 08/07/18   Lenn Sink, DPM  ?ibandronate (BONIVA) 150 MG tablet Take 150 mg by mouth every 30 (thirty) days. 04/19/21   [provider]  ?meloxicam (MOBIC) 15 MG tablet Take 1 tablet (15 mg total) by mouth daily. 05/10/21   Hyatt, Max T, DPM  ?REPATHA SURECLICK 140 MG/ML SOAJ Inject into the skin. 05/05/21   [provider]  ?rosuvastatin (CRESTOR) 20 MG  tablet Take 20 mg by mouth daily. 04/10/21   [provider]  ?   ? ?Allergies    ?Patient has no known allergies.   ? ?Review of Systems   ?Review of Systems ?Ten systems reviewed and are negative for acute change, except as noted in the HPI.  ? ?Physical Exam ?Updated Vital Signs ?BP (!) 137/97 (BP Location: Right Arm)   Pulse 68   Temp 98.4 ?F (36.9 ?C) (Oral)   Resp 12   Ht 5\' 6"  (1.676 m)   Wt 70.8 kg   SpO2 100%   BMI 25.18 kg/m?  ?Physical Exam ?Vitals and nursing note reviewed.  ?Constitutional:   ?   General: She is not in acute distress. ?   Appearance: She is well-developed.  ?HENT:  ?   Head: Normocephalic and atraumatic.  ?Eyes:  ?   Conjunctiva/sclera: Conjunctivae normal.  ?Cardiovascular:  ?   Rate and Rhythm: Normal rate and regular rhythm.  ?   Heart sounds: No murmur heard. ?Pulmonary:  ?   Effort: Pulmonary effort is normal. No respiratory distress.  ?   Breath sounds: Normal breath sounds.  ?Chest:  ?   Comments: No reproducible chest wall tenderness ?Abdominal:  ?   Palpations: Abdomen is soft.  ?   Tenderness: There is no abdominal tenderness.  ?Musculoskeletal:     ?  General: No swelling.  ?   Cervical back: Neck supple.  ?Skin: ?   General: Skin is warm and dry.  ?   Capillary Refill: Capillary refill takes less than 2 seconds.  ?Neurological:  ?   Mental Status: She is alert.  ?Psychiatric:     ?   Mood and Affect: Mood normal.  ? ? ?ED Results / Procedures / Treatments   ?Labs ?(all labs ordered are listed, but only abnormal results are displayed) ?Labs Reviewed  ?BASIC METABOLIC PANEL - Abnormal; Notable for the following components:  ?    Result Value  ? Glucose, Bld 102 (*)   ? All other components within normal limits  ?CBC  ?TROPONIN I (HIGH SENSITIVITY)  ?TROPONIN I (HIGH SENSITIVITY)  ? ? ?EKG ?EKG Interpretation ? ?Date/Time:  Tuesday Nov 08 2021 09:17:04 EDT ?Ventricular Rate:  85 ?PR Interval:  192 ?QRS Duration: 165 ?QT Interval:  422 ?QTC Calculation: 502 ?R  Axis:   -16 ?Text Interpretation: Sinus rhythm Left bundle branch block Confirmed by Lockie Mola, Adam ([id number]) on 11/08/2021 9:18:24 AM ? ?Radiology ?DG Chest Portable 1 View ? ?Result Date: 11/08/2021 ?CLINICAL DATA:  61 year old female with mid chest pain. EXAM: PORTABLE CHEST 1 VIEW COMPARISON:  None Available. FINDINGS: Portable AP semi upright view at 0917 hours. Mildly rotated to the right. Lung volumes and mediastinal contours are within normal limits. Visualized tracheal air column is within normal limits. Allowing for portable technique the lungs are clear. No pneumothorax or pleural effusion. No osseous abnormality identified. IMPRESSION: Negative portable chest. Electronically Signed   By: Odessa Fleming M.D.   On: 11/08/2021 09:32   ? ?Procedures ?Procedures  ? ? ?Medications Ordered in ED ?Medications  ?nitroGLYCERIN (NITROSTAT) SL tablet 0.4 mg (0.4 mg Sublingual Given 11/08/21 1243)  ? ? ?ED Course/ Medical Decision Making/ A&P ?  ?                        ?Medical Decision Making ?Amount and/or Complexity of Data Reviewed ?Labs: ordered. ?Radiology: ordered. ? ? ?61 year old female presenting with chest tightness.  On arrival, she is well-appearing, slightly anxious appearing, in no acute distress, resting comfortably in the ER bed.  She is slightly hypertensive with a blood pressure 165/90, afebrile, no tachycardia, tachypnea or hypoxic.  Physical exam is unremarkable.  No lower extremity edema.  Lung sounds clear.  Reproducible chest wall tenderness.  Differential diagnosis includes ACS, PE, anxiety, stable/unstable angina, dissection, PNA, pericarditis, myocarditis ? ? ? ?EMR reviewed including pt PMHx, past surgical history and past visits to ER.  ? ? ?I personally reviewed outside records including her coronary CT which was largely unremarkable.  ? ? ? ? ?Lab Tests: ? ?I ordered and independently interpreted labs.  The pertinent results include:   ? ?I personally reviewed all laboratory work and imaging.  Metabolic panel without any acute abnormality specifically kidney function within normal limits and no significant electrolyte abnormalities. CBC without leukocytosis or significant anemia. ? ? ?Imaging Studies: ? ?NAD. I personally reviewed all imaging studies and no acute abnormality found. I agree with radiology interpretation. ? ? ? ?Cardiac Monitoring: ? ?The patient was maintained on a cardiac monitor.  I personally viewed and interpreted the cardiac monitored which showed an underlying rhythm of: ?EKG non-ischemic ? ? ?Medicines ordered: ? ?NA ? ? ?Critical Interventions: ? ?NA ? ? ?Consults: ? ?NA ? ? ? ?Reevaluation: ? ?After the interventions noted above I re-evaluated patient and  found that they have :improved ? ? ?Social Determinants of Health: ? ?NA ? ? ? ?Problem List / ED Course: ? ?61 year old female presenting with chest pain.  Overall her work-up was reassuring, delta troponins were negative.  EKG was nonischemic.  Her heart score was 5.  I have low suspicion for PE,  Wells score is 0.  Low suspicion for dissection.  No evidence of pneumonia on chest x-ray.  I personally reviewed her CT of her coronaries in 2019 which was virtually normal.  Patient did continue to have some chest pain throughout her ED stay, she was given nitroglycerin sublingually and her pain improved from a 6/10 to a 3/10. Had a joint decision making conversation with the patient.   I did discuss and offer admission for further cardiac work-up, however she follows closely with cardiology at [facility] and would prefer to follow-up with them.  Provided several pills of nitro per her request.  I discussed strict return precautions.  She was understanding is agreeable.  Stable for discharge ? ? ?Dispostion: ? ?After consideration of the diagnostic results and the patients response to treatment, I feel that the patent would benefit from discharge with very strict return precautions. ? ?Case discussed with Dr. Lockie Mola who is agreeable  to the above plan and disposition.  ? ? ?Final Clinical Impression(s) / ED Diagnoses ?Final diagnoses:  ?Chest pain, unspecified type  ? ? ?Rx / DC Orders ?ED Discharge Orders   ? ?      Ordered  ?  nitroGLYCERIN (NIT

## 2021-11-16 ENCOUNTER — Ambulatory Visit: Payer: BC Managed Care – PPO | Admitting: Cardiology

## 2021-11-16 ENCOUNTER — Encounter: Payer: Self-pay | Admitting: Cardiology

## 2021-11-16 VITALS — BP 125/86 | HR 78 | Temp 98.4°F | Resp 17 | Ht 66.0 in | Wt 156.0 lb

## 2021-11-16 DIAGNOSIS — R030 Elevated blood-pressure reading, without diagnosis of hypertension: Secondary | ICD-10-CM

## 2021-11-16 DIAGNOSIS — R931 Abnormal findings on diagnostic imaging of heart and coronary circulation: Secondary | ICD-10-CM

## 2021-11-16 DIAGNOSIS — R072 Precordial pain: Secondary | ICD-10-CM

## 2021-11-16 DIAGNOSIS — Z8249 Family history of ischemic heart disease and other diseases of the circulatory system: Secondary | ICD-10-CM

## 2021-11-16 DIAGNOSIS — I447 Left bundle-branch block, unspecified: Secondary | ICD-10-CM

## 2021-11-16 MED ORDER — ASPIRIN 81 MG PO CHEW: 81.0000 mg | CHEWABLE_TABLET | Freq: Every day | ORAL | Status: DC

## 2021-11-16 NOTE — Progress Notes (Signed)
Primary Physician/Referring:  Crist Infante, MD  Patient ID: Monica Adams, female    DOB: 03/19/1961, 61 y.o.   MRN: [id number]  Chief Complaint  Patient presents with   New Patient (Initial Visit)   Atrial Fibrillation   Coronary Artery Disease   HPI:    Monica Adams  is a 61 y.o. Caucasian female patient with multiple members in the family having had MI in their early 61 years of age (Mother with MI in early 67s. Brother at age 21 had 2 coronary stents. Both maternal grandparents with CAD in 75s), hyperlipidemia, elevated LPA,  coronary calcium scoring done on 12/26/2017 which was 61 and placed during the 99 percentile.  I had last seen her almost 4 years ago now presents to reestablish care. Patient was seen in the emergency room on 11/08/2021 with chest pain radiating to the back, serum troponins and EKG were unremarkable and she was discharged home with recommendation to follow-up in the outpatient basis.  Patient has had occasional episodes of chest tightness on the left side of the chest and also central chest with radiation to the back when she is walking but were described as mild over the past 6 to 8 months.  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  She got extremely nervous and was also sweaty but denies any palpitations, nausea or vomiting.  She tried to take it easy for couple hours, in spite of this pain persisted hence he presented to the emergency room.  States that she has still had occasional episodes of chest pain but lasting few minutes and very mild.  States that it was mild enough that she did not feel like using the nitroglycerin.  Past Medical History:  Diagnosis Date   Hyperlipidemia    Osteoporosis    Past Surgical History:  Procedure Laterality Date   kidney stone removal     Family History  Problem  Relation Age of Onset   Heart attack Mother 56   Heart disease Mother    Kidney disease Mother    Alzheimer's disease Father    Heart disease Brother 54       Coronary stents   Heart attack Maternal Grandmother 71   Heart disease Maternal Grandfather 58    Social History   Tobacco Use   Smoking status: Former    Packs/day: 0.25    Years: 4.00    Pack years: 1.00    Types: Cigarettes   Smokeless tobacco: Never   Tobacco comments:    Smoked in 20's for about 4 years  Substance Use Topics   Alcohol use: Not on file   Marital Status: Married  ROS  Review of Systems  Cardiovascular:  Positive for chest pain. Negative for dyspnea on exertion and leg swelling.  Objective  Blood pressure 125/86, pulse 78, temperature 98.4 F (36.9 C), temperature source Temporal, resp. rate 17, height 5' 6"  (1.676 m), weight 156 lb (70.8 kg), SpO2 98 %. Body mass index is 25.18 kg/m.     11/16/2021    1:23 PM 11/08/2021    1:06 PM 11/08/2021   12:41 PM  Vitals with BMI  Height 5' 6"     Weight 156 lbs    BMI 07.12    Systolic 197 588 325  Diastolic 86 89 97  Pulse 78 68 68  Physical Exam Neck:     Vascular: No JVD.  Cardiovascular:     Rate and Rhythm: Normal rate and regular rhythm.     Pulses: Intact distal pulses.     Heart sounds: Murmur heard.  Early systolic murmur is present with a grade of 2/6 at the upper right sternal border.    No gallop.  Pulmonary:     Effort: Pulmonary effort is normal.     Breath sounds: Normal breath sounds.  Abdominal:     General: Bowel sounds are normal.     Palpations: Abdomen is soft.  Musculoskeletal:     Right lower leg: No edema.     Left lower leg: No edema.    Medications and allergies  No Known Allergies   Medication list after today's encounter   Current Outpatient Medications:    Ascorbic Acid (VITA-C PO), Take by mouth., Disp: , Rfl:    aspirin (ASPIRIN CHILDRENS) 81 MG chewable tablet, Chew 1 tablet (81 mg total) by mouth  daily., Disp: , Rfl:    cholecalciferol (VITAMIN D) 25 MCG (1000 UNIT) tablet, Take 1 tablet by mouth daily., Disp: , Rfl:    cyanocobalamin 100 MCG tablet, Take 100 mcg by mouth daily., Disp: , Rfl:    ibandronate (BONIVA) 150 MG tablet, Take 150 mg by mouth every 30 (thirty) days., Disp: , Rfl:    meloxicam (MOBIC) 15 MG tablet, Take 1 tablet (15 mg total) by mouth daily. (Patient taking differently: Take 15 mg by mouth every other day.), Disp: 30 tablet, Rfl: 3   nitroGLYCERIN (NITROSTAT) 0.4 MG SL tablet, Place 1 tablet (0.4 mg total) under the tongue every 5 (five) minutes as needed for up to 4 doses for chest pain., Disp: 4 tablet, Rfl: 0   REPATHA SURECLICK 268 MG/ML SOAJ, Inject into the skin., Disp: , Rfl:    rosuvastatin (CRESTOR) 20 MG tablet, Take 20 mg by mouth daily., Disp: , Rfl:   Laboratory examination:   Recent Labs    11/08/21 0927  NA 140  K 4.3  CL 111  CO2 24  GLUCOSE 102*  BUN 19  CREATININE 0.71  CALCIUM 9.2  GFRNONAA >60   estimated creatinine clearance is 71.8 mL/min (by C-G formula based on SCr of 0.71 mg/dL).     Latest Ref Rng & Units 11/08/2021    9:27 AM  CMP  Glucose 70 - 99 mg/dL 102    BUN 6 - 20 mg/dL 19    Creatinine 0.44 - 1.00 mg/dL 0.71    Sodium 135 - 145 mmol/L 140    Potassium 3.5 - 5.1 mmol/L 4.3    Chloride 98 - 111 mmol/L 111    CO2 22 - 32 mmol/L 24    Calcium 8.9 - 10.3 mg/dL 9.2        Latest Ref Rng & Units 11/08/2021    9:27 AM  CBC  WBC 4.0 - 10.5 K/uL 4.8    Hemoglobin 12.0 - 15.0 g/dL 14.0    Hematocrit 36.0 - 46.0 % 41.2    Platelets 150 - 400 K/uL 245     External labs:   Labs done 05/15/2021:  BUN 9, creatinine 0.8, EGFR 99.6, potassium 4.8, LFTs normal.  Hb 13.1/HCT 39.0, platelets 256, normal indicis.  Total cholesterol 137, triglycerides 34, HDL 85, LDL 45.  TSH normal at 1.00, vitamin D 36.7.  Radiology:    Cardiac Studies:   Coronary calcium score 12/26/2017: Calcium score: 74.  LM: 0.  LAD:  74.  Cx: 0.  RCA: 0   Treadmill exercise stress test 03/04/2018 Indication: screening for CAD The patient exercised on Bruce protocol for  12:11 min. Patient achieved  13.60 METS and reached HR  182 bpm, which is  109 % of maximum age-predicted HR.  Stress test terminated due to fatigue. Resting EKG demonstrates Normal sinus rhythm. ST Changes: With peak exercise there was no ST-T changes of ischemia. Arrhythmias: none. Chest Pain: none. BP Response to Exercise: Normal resting BP- appropriate response. HR Response to Exercise: Appropriate.  Exercise capacity was above average for age. Continue primary/secondary prevention.  EKG:   EKG 11/16/2021: Normal sinus rhythm at rate of 66 bpm, left bundle branch block.  No further analysis.  No significant change from 11/08/2021.  However, compared to 08/30/2017, left bundle branch block is new. Assessment     ICD-10-CM   1. Precordial chest pain  R07.2 EKG 12-Lead    PCV ECHOCARDIOGRAM COMPLETE    PCV MYOCARDIAL PERFUSION WITH LEXISCAN    2. LBBB (left bundle branch block)  I44.7 PCV ECHOCARDIOGRAM COMPLETE    PCV MYOCARDIAL PERFUSION WITH LEXISCAN    3. Elevated coronary artery calcium score 12/26/17 was74, 99 percentile in [place]  R93.1 PCV ECHOCARDIOGRAM COMPLETE    PCV MYOCARDIAL PERFUSION WITH LEXISCAN    aspirin (ASPIRIN CHILDRENS) 81 MG chewable tablet    4. Family history of premature CAD  Z13.49    Mother with MI in early 81s. Brother at age 59 had 2 coronary stents. Both maternal grandparents with CAD in 43s    5. Elevated BP without diagnosis of hypertension  R03.0        Medications Discontinued During This Encounter  Medication Reason   diclofenac (VOLTAREN) 75 MG EC tablet     Meds ordered this encounter  Medications   aspirin (ASPIRIN CHILDRENS) 81 MG chewable tablet    Sig: Chew 1 tablet (81 mg total) by mouth daily.   Orders Placed This Encounter  Procedures   PCV MYOCARDIAL PERFUSION WITH LEXISCAN    Standing  Status:   Future    Standing Expiration Date:   01/16/2022   EKG 12-Lead   PCV ECHOCARDIOGRAM COMPLETE    Standing Status:   Future    Standing Expiration Date:   11/17/2022   Recommendations:   Monica Adams is a 61 y.o.  Caucasian female patient with multiple members in the family having had MI in their early 21 years of age (Mother with MI in early 47s. Brother at age 62 had 2 coronary stents. Both maternal grandparents with CAD in 61s), hyperlipidemia, elevated LPA,  coronary calcium scoring done on 12/26/2017 which was 31 and placed during the 99 percentile.  I had last seen her almost 4 years ago now presents to reestablish care. Patient was seen in the emergency room on 11/08/2021 with chest pain radiating to the back, serum troponins and EKG were unremarkable and she was discharged home with recommendation to follow-up in the outpatient basis.  Although patient's symptoms are suggestive of musculoskeletal chest pain, she did get some relief with taking sublingual nitroglycerin, she has continued to have occasional mild chest discomfort with exertional activities.  Hence angina pectoris with proximal LAD stenosis cannot be excluded especially in view of new left bundle branch block.  Fortunately cardiac troponins were negative for myocardial injury.  I will set her up for a Lexiscan nuclear stress test and an echocardiogram.  She has aortic sclerotic murmur as  well.  Patient instructed to start ASA  71m q daily for prophylaxis.  She has nitroglycerin to use, advised her she should have a low threshold to go to the emergency room if she has unrelenting chest pain or call [place] she has to use nitroglycerin on a frequent basis.  Her blood pressure was also slightly elevated today, I continues this to place her on a vasodilator therapy including either amlodipine 5 mg daily or if significant CAD is ruled out, would prefer to place her on ARB or ACE inhibitors.  With regard to hyperlipidemia, lipids  are under excellent control.  She is now on high-dose high intensity statin Crestor 20 mg along with Repatha.  Continue the same for now.  Office visit in 3 to 4 weeks for follow-up.  This was a 60-minute office visit encounter with review of external records, review of hospitalization records and making complex decisions.    JAdrian Prows MD, F[facility]5/24/2023, 2:14 PM Office: [phone]

## 2021-11-23 ENCOUNTER — Ambulatory Visit: Payer: BC Managed Care – PPO

## 2021-11-23 DIAGNOSIS — I447 Left bundle-branch block, unspecified: Secondary | ICD-10-CM | POA: Diagnosis not present

## 2021-11-23 DIAGNOSIS — R931 Abnormal findings on diagnostic imaging of heart and coronary circulation: Secondary | ICD-10-CM

## 2021-11-23 DIAGNOSIS — R072 Precordial pain: Secondary | ICD-10-CM

## 2021-11-25 ENCOUNTER — Encounter: Payer: Self-pay | Admitting: Cardiology

## 2021-11-29 NOTE — Telephone Encounter (Signed)
From patient.

## 2021-12-01 ENCOUNTER — Encounter: Payer: BC Managed Care – PPO | Admitting: Podiatry

## 2021-12-07 ENCOUNTER — Ambulatory Visit: Payer: BC Managed Care – PPO

## 2021-12-07 DIAGNOSIS — R072 Precordial pain: Secondary | ICD-10-CM | POA: Diagnosis not present

## 2021-12-07 DIAGNOSIS — R931 Abnormal findings on diagnostic imaging of heart and coronary circulation: Secondary | ICD-10-CM | POA: Diagnosis not present

## 2021-12-07 DIAGNOSIS — I447 Left bundle-branch block, unspecified: Secondary | ICD-10-CM | POA: Diagnosis not present

## 2021-12-08 ENCOUNTER — Encounter: Payer: BC Managed Care – PPO | Admitting: Podiatry

## 2021-12-22 ENCOUNTER — Encounter: Payer: BC Managed Care – PPO | Admitting: Podiatry

## 2021-12-28 ENCOUNTER — Encounter: Payer: Self-pay | Admitting: Cardiology

## 2021-12-28 ENCOUNTER — Ambulatory Visit: Payer: BC Managed Care – PPO | Admitting: Cardiology

## 2021-12-28 VITALS — BP 136/83 | HR 78 | Temp 98.7°F | Resp 16 | Ht 66.0 in | Wt 157.8 lb

## 2021-12-28 DIAGNOSIS — I447 Left bundle-branch block, unspecified: Secondary | ICD-10-CM | POA: Diagnosis not present

## 2021-12-28 DIAGNOSIS — R931 Abnormal findings on diagnostic imaging of heart and coronary circulation: Secondary | ICD-10-CM

## 2021-12-28 DIAGNOSIS — I5022 Chronic systolic (congestive) heart failure: Secondary | ICD-10-CM

## 2021-12-28 DIAGNOSIS — R072 Precordial pain: Secondary | ICD-10-CM | POA: Diagnosis not present

## 2021-12-28 MED ORDER — CARVEDILOL 6.25 MG PO TABS: 6.2500 mg | ORAL_TABLET | Freq: Two times a day (BID) | ORAL | 3 refills | Status: DC

## 2021-12-28 MED ORDER — SACUBITRIL-VALSARTAN 24-26 MG PO TABS: 1.0000 | ORAL_TABLET | Freq: Two times a day (BID) | ORAL | Status: DC

## 2021-12-28 NOTE — H&P (View-Only) (Signed)
Primary Physician/Referring:  Crist Infante, MD  Patient ID: Monica Adams, female    DOB: 03-Nov-1960, 61 y.o.   MRN: [id number]  No chief complaint on file.  HPI:    Monica Adams  is a 61 y.o. Caucasian female patient with multiple members in the family having had MI in their early 43 years of age (Mother with MI in early 24s. Brother at age 2 had 2 coronary stents. Both maternal grandparents with CAD in 31s), hyperlipidemia, elevated LPA,  coronary calcium scoring done on 12/26/2017 which was 40 and placed during the 99 percentile.  Patient was seen in the emergency room on 11/08/2021 with chest pain radiating to the back, serum troponins and EKG were unremarkable and she was discharged home with recommendation to follow-up in the outpatient basis.  Although patient's symptoms are suggestive of musculoskeletal chest pain, she did get some relief with taking sublingual nitroglycerin in the ED, she has continued to have occasional mild chest discomfort with exertional activities.  Hence angina pectoris with proximal LAD stenosis cannot be excluded especially in view of new left bundle branch block.    She underwent nuclear stress test and echocardiogram.  States that she has still had occasional episodes of chest pain but lasting few minutes and very mild.  States that it was mild enough that she did not feel like using the nitroglycerin.  She is accompanied by her husband.  Past Medical History:  Diagnosis Date   Hyperlipidemia    Osteoporosis    Past Surgical History:  Procedure Laterality Date   kidney stone removal     Family History  Problem Relation Age of Onset   Heart attack Mother 59   Heart disease Mother    Kidney disease Mother    Alzheimer's disease Father    Diabetes Brother    Heart attack Maternal Grandmother 59   Heart disease Maternal Grandfather 103   Heart disease Half-Brother 59       stents    Social History   Tobacco Use   Smoking status: Former     Packs/day: 0.25    Years: 4.00    Total pack years: 1.00    Types: Cigarettes    Quit date: 1985    Years since quitting: 38.5   Smokeless tobacco: Never   Tobacco comments:    Smoked in 20's for about 4 years  Substance Use Topics   Alcohol use: Yes    Alcohol/week: 2.0 standard drinks of alcohol    Types: 1 Glasses of wine, 1 Shots of liquor per week    Comment: occasionally   Marital Status: Married  ROS  Review of Systems  Cardiovascular:  Positive for chest pain. Negative for dyspnea on exertion and leg swelling.   Objective  Blood pressure 136/83, pulse 78, temperature 98.7 F (37.1 C), temperature source Temporal, resp. rate 16, height 5' 6"  (1.676 m), weight 157 lb 12.8 oz (71.6 kg), SpO2 97 %. Body mass index is 25.47 kg/m.     12/28/2021    1:08 PM 11/16/2021    1:23 PM 11/08/2021    1:06 PM  Vitals with BMI  Height 5' 6"  5' 6"    Weight 157 lbs 13 oz 156 lbs   BMI 29.79 89.21   Systolic 194 174 081  Diastolic 83 86 89  Pulse 78 78 68    Physical Exam Neck:     Vascular: No JVD.  Cardiovascular:     Rate and Rhythm: Normal  rate and regular rhythm.     Pulses: Intact distal pulses.     Heart sounds: Murmur heard.     Early systolic murmur is present with a grade of 2/6 at the upper right sternal border.     No gallop.  Pulmonary:     Effort: Pulmonary effort is normal.     Breath sounds: Normal breath sounds.  Abdominal:     General: Bowel sounds are normal.     Palpations: Abdomen is soft.  Musculoskeletal:     Right lower leg: No edema.     Left lower leg: No edema.     Medications and allergies  No Known Allergies   Medication list after today's encounter   Current Outpatient Medications:    aspirin (ASPIRIN CHILDRENS) 81 MG chewable tablet, Chew 1 tablet (81 mg total) by mouth daily., Disp: , Rfl:    carvedilol (COREG) 6.25 MG tablet, Take 1 tablet (6.25 mg total) by mouth 2 (two) times daily., Disp: 180 tablet, Rfl: 3   ibandronate (BONIVA)  150 MG tablet, Take 150 mg by mouth every 30 (thirty) days., Disp: , Rfl:    meloxicam (MOBIC) 15 MG tablet, Take 1 tablet (15 mg total) by mouth daily. (Patient taking differently: Take 15 mg by mouth every other day.), Disp: 30 tablet, Rfl: 3   Multiple Vitamins-Minerals (ONE A DAY WOMEN 50 PLUS PO), Take 1 tablet by mouth daily., Disp: , Rfl:    nitroGLYCERIN (NITROSTAT) 0.4 MG SL tablet, Place 1 tablet (0.4 mg total) under the tongue every 5 (five) minutes as needed for up to 4 doses for chest pain., Disp: 4 tablet, Rfl: 0   REPATHA SURECLICK 884 MG/ML SOAJ, Inject into the skin., Disp: , Rfl:    rosuvastatin (CRESTOR) 20 MG tablet, Take 20 mg by mouth daily., Disp: , Rfl:   Current Facility-Administered Medications:    sacubitril-valsartan (ENTRESTO) 24-26 mg per tablet, 1 tablet, Oral, BID, Adrian Prows, MD  Laboratory examination:   Recent Labs    11/08/21 0927  NA 140  K 4.3  CL 111  CO2 24  GLUCOSE 102*  BUN 19  CREATININE 0.71  CALCIUM 9.2  GFRNONAA >60   CrCl cannot be calculated (Patient's most recent lab result is older than the maximum 21 days allowed.).     Latest Ref Rng & Units 11/08/2021    9:27 AM  CMP  Glucose 70 - 99 mg/dL 102   BUN 6 - 20 mg/dL 19   Creatinine 0.44 - 1.00 mg/dL 0.71   Sodium 135 - 145 mmol/L 140   Potassium 3.5 - 5.1 mmol/L 4.3   Chloride 98 - 111 mmol/L 111   CO2 22 - 32 mmol/L 24   Calcium 8.9 - 10.3 mg/dL 9.2       Latest Ref Rng & Units 11/08/2021    9:27 AM  CBC  WBC 4.0 - 10.5 K/uL 4.8   Hemoglobin 12.0 - 15.0 g/dL 14.0   Hematocrit 36.0 - 46.0 % 41.2   Platelets 150 - 400 K/uL 245    External labs:   Labs done 05/15/2021:  BUN 9, creatinine 0.8, EGFR 99.6, potassium 4.8, LFTs normal.  Hb 13.1/HCT 39.0, platelets 256, normal indicis.  Total cholesterol 137, triglycerides 34, HDL 85, LDL 45.  TSH normal at 1.00, vitamin D 36.7.  Radiology:    Cardiac Studies:   Coronary calcium score 12/26/2017: Calcium score:  74.  LM: 0.  LAD: 74.  Cx: 0.  RCA:  0   Treadmill exercise stress test 03/04/2018 Indication: screening for CAD The patient exercised on Bruce protocol for  12:11 min. Patient achieved  13.60 METS and reached HR  182 bpm, which is  109 % of maximum age-predicted HR.  Stress test terminated due to fatigue. Resting EKG demonstrates Normal sinus rhythm. ST Changes: With peak exercise there was no ST-T changes of ischemia. Arrhythmias: none. Chest Pain: none. BP Response to Exercise: Normal resting BP- appropriate response. HR Response to Exercise: Appropriate.  Exercise capacity was above average for age. Continue primary/secondary prevention.  PCV MYOCARDIAL PERFUSION WITH LEXISCAN 12/07/2021  Narrative Lexiscan Nuclear stress test 12/07/2021: Nondiagnostic ECG stress. ECG demonstrated normal sinus rhythm with left bundle branch block. The heart rate and BP response was consistent with Regadenoson. Myocardial perfusion is abnormal. There is a fixed mild defect in the anterior and anteroseptal and apical region. Overall LV systolic function is moderately abnormal without regional wall motion abnormalities and there is global hypokinesis. Stress LV EF: 34%. Findings may be secondary to LBBB. Correlate with echocardiogram. No previous exam available for comparison. High risk study.   PCV ECHOCARDIOGRAM COMPLETE 11/23/2021  Narrative Echocardiogram 11/23/2021: Left ventricle cavity is normal in size. Mild concentric hypertrophy of the left ventricle. Abnormal septal wall motion due to left bundle branch block. Severe global hypokinesis. LVEF 25-30%. Apec not well visualized. Doppler evidence of grade I (impaired) diastolic dysfunction, normal LAP. Mild (Grade I) mitral regurgitation. Normal right atrial pressure.     EKG:   EKG 11/16/2021: Normal sinus rhythm at rate of 66 bpm, left bundle branch block.  No further analysis.  No significant change from 11/08/2021.  However, compared to  08/30/2017, left bundle branch block is new. Assessment     ICD-10-CM   1. Precordial chest pain  R07.2     2. LBBB (left bundle branch block)  I44.7     3. Chronic systolic heart failure (HCC)  P73.57 Basic metabolic panel    CBC    sacubitril-valsartan (ENTRESTO) 24-26 mg per tablet    carvedilol (COREG) 6.25 MG tablet    Brain natriuretic peptide    4. Elevated coronary artery calcium score 12/26/17 was74, 99 percentile in [place]  R93.1        Medications Discontinued During This Encounter  Medication Reason   Ascorbic Acid (VITA-C PO)    cholecalciferol (VITAMIN D) 25 MCG (1000 UNIT) tablet    cyanocobalamin 100 MCG tablet     Meds ordered this encounter  Medications   sacubitril-valsartan (ENTRESTO) 24-26 mg per tablet    Order Specific Question:   ACE-inhibitors have NOT been administered in the past 36-hours.    Answer:   YES (confirmed by ordering provider)   carvedilol (COREG) 6.25 MG tablet    Sig: Take 1 tablet (6.25 mg total) by mouth 2 (two) times daily.    Dispense:  180 tablet    Refill:  3   Orders Placed This Encounter  Procedures   Basic metabolic panel   CBC   Brain natriuretic peptide   Recommendations:   Monica Adams is a 61 y.o.  Caucasian female patient with multiple members in the family having had MI in their early 52 years of age (Mother with MI in early 1s. Brother at age 44 had 2 coronary stents. Both maternal grandparents with CAD in 49s), hyperlipidemia, elevated LPA,  coronary calcium scoring done on 12/26/2017 which was 72 and placed during the 99 percentile.  Patient was seen  in the emergency room on 11/08/2021 with chest pain radiating to the back, serum troponins and EKG were unremarkable and she was discharged home with recommendation to follow-up in the outpatient basis.  Although patient's symptoms are suggestive of musculoskeletal chest pain, she did get some relief with taking sublingual nitroglycerin in the ED, she has  continued to have occasional mild chest discomfort with exertional activities.  Hence angina pectoris with proximal LAD stenosis cannot be excluded especially in view of new left bundle branch block.    She underwent nuclear stress test and echocardiogram.  Nuclear stress test reveals fixed scar in the anterior wall probably related to LBBB and moderately reduced LVEF however the echocardiogram also reveals markedly reduced LVEF but global hypokinesis and septal motion consistent with LBBB.  Patient is not in any clinical heart failure.  I suspect she probably has nonischemic cardiomyopathy however cannot exclude LAD territory significant coronary disease.  In view of her strong family history, best option is to proceed with cardiac catheterization.  In the interim I will go ahead and start her on guideline directed medical therapy with carvedilol 6.25 mg twice daily (not an active heart failure) and also Entresto 24/26 mg twice daily.  She will follow-up with me in 2 weeks for up titration of medications with repeat labs including BMP, proBNP.  CBC ordered as she will be scheduled for cardiac catheterization.  Patient's husband is present.  All questions answered.  Schedule for right and left cardiac catheterization, and possible angioplasty. We discussed regarding risks, benefits, alternatives to this including stress testing, CTA and continued medical therapy. Patient wants to proceed. Understands <1-2% risk of death, stroke, MI, urgent CABG, bleeding, infection, renal failure but not limited to these.     Adrian Prows, MD, [facility] 12/28/2021, 6:02 PM Office: [phone]

## 2021-12-28 NOTE — Progress Notes (Signed)
Primary Physician/Referring:  Crist Infante, MD  Patient ID: Monica Adams, female    DOB: [birth year], 61 y.o.   MRN: [id number]  No chief complaint on file.  HPI:    Monica Adams  is a 61 y.o. Caucasian female patient with multiple members in the family having had MI in their early 59 years of age (Mother with MI in early 59s. Brother at age 59 had 2 coronary stents. Both maternal grandparents with CAD in 3s), hyperlipidemia, elevated LPA,  coronary calcium scoring done on 12/26/2017 which was 52 and placed during the 99 percentile.  Patient was seen in the emergency room on 11/08/2021 with chest pain radiating to the back, serum troponins and EKG were unremarkable and she was discharged home with recommendation to follow-up in the outpatient basis.  Although patient's symptoms are suggestive of musculoskeletal chest pain, she did get some relief with taking sublingual nitroglycerin in the ED, she has continued to have occasional mild chest discomfort with exertional activities.  Hence angina pectoris with proximal LAD stenosis cannot be excluded especially in view of new left bundle branch block.    She underwent nuclear stress test and echocardiogram.  States that she has still had occasional episodes of chest pain but lasting few minutes and very mild.  States that it was mild enough that she did not feel like using the nitroglycerin.  She is accompanied by her husband.  Past Medical History:  Diagnosis Date   Hyperlipidemia    Osteoporosis    Past Surgical History:  Procedure Laterality Date   kidney stone removal     Family History  Problem Relation Age of Onset   Heart attack Mother 20   Heart disease Mother    Kidney disease Mother    Alzheimer's disease Father    Diabetes Brother    Heart attack Maternal Grandmother 34   Heart disease Maternal Grandfather 37   Heart disease Half-Brother 93       stents    Social History   Tobacco Use   Smoking status: Former     Packs/day: 0.25    Years: 4.00    Total pack years: 1.00    Types: Cigarettes    Quit date: 1985    Years since quitting: 38.5   Smokeless tobacco: Never   Tobacco comments:    Smoked in 20's for about 4 years  Substance Use Topics   Alcohol use: Yes    Alcohol/week: 2.0 standard drinks of alcohol    Types: 1 Glasses of wine, 1 Shots of liquor per week    Comment: occasionally   Marital Status: Married  ROS  Review of Systems  Cardiovascular:  Positive for chest pain. Negative for dyspnea on exertion and leg swelling.   Objective  Blood pressure 136/83, pulse 78, temperature 98.7 F (37.1 C), temperature source Temporal, resp. rate 16, height 5' 6"  (1.676 m), weight 157 lb 12.8 oz (71.6 kg), SpO2 97 %. Body mass index is 25.47 kg/m.     12/28/2021    1:08 PM 11/16/2021    1:23 PM 11/08/2021    1:06 PM  Vitals with BMI  Height 5' 6"  5' 6"    Weight 157 lbs 13 oz 156 lbs   BMI 35.46 56.81   Systolic 275 170 017  Diastolic 83 86 89  Pulse 78 78 68    Physical Exam Neck:     Vascular: No JVD.  Cardiovascular:     Rate and Rhythm: Normal  rate and regular rhythm.     Pulses: Intact distal pulses.     Heart sounds: Murmur heard.     Early systolic murmur is present with a grade of 2/6 at the upper right sternal border.     No gallop.  Pulmonary:     Effort: Pulmonary effort is normal.     Breath sounds: Normal breath sounds.  Abdominal:     General: Bowel sounds are normal.     Palpations: Abdomen is soft.  Musculoskeletal:     Right lower leg: No edema.     Left lower leg: No edema.     Medications and allergies  No Known Allergies   Medication list after today's encounter   Current Outpatient Medications:    aspirin (ASPIRIN CHILDRENS) 81 MG chewable tablet, Chew 1 tablet (81 mg total) by mouth daily., Disp: , Rfl:    carvedilol (COREG) 6.25 MG tablet, Take 1 tablet (6.25 mg total) by mouth 2 (two) times daily., Disp: 180 tablet, Rfl: 3   ibandronate (BONIVA)  150 MG tablet, Take 150 mg by mouth every 30 (thirty) days., Disp: , Rfl:    meloxicam (MOBIC) 15 MG tablet, Take 1 tablet (15 mg total) by mouth daily. (Patient taking differently: Take 15 mg by mouth every other day.), Disp: 30 tablet, Rfl: 3   Multiple Vitamins-Minerals (ONE A DAY WOMEN 50 PLUS PO), Take 1 tablet by mouth daily., Disp: , Rfl:    nitroGLYCERIN (NITROSTAT) 0.4 MG SL tablet, Place 1 tablet (0.4 mg total) under the tongue every 5 (five) minutes as needed for up to 4 doses for chest pain., Disp: 4 tablet, Rfl: 0   REPATHA SURECLICK 654 MG/ML SOAJ, Inject into the skin., Disp: , Rfl:    rosuvastatin (CRESTOR) 20 MG tablet, Take 20 mg by mouth daily., Disp: , Rfl:   Current Facility-Administered Medications:    sacubitril-valsartan (ENTRESTO) 24-26 mg per tablet, 1 tablet, Oral, BID, Adrian Prows, MD  Laboratory examination:   Recent Labs    11/08/21 0927  NA 140  K 4.3  CL 111  CO2 24  GLUCOSE 102*  BUN 19  CREATININE 0.71  CALCIUM 9.2  GFRNONAA >60   CrCl cannot be calculated (Patient's most recent lab result is older than the maximum 21 days allowed.).     Latest Ref Rng & Units 11/08/2021    9:27 AM  CMP  Glucose 70 - 99 mg/dL 102   BUN 6 - 20 mg/dL 19   Creatinine 0.44 - 1.00 mg/dL 0.71   Sodium 135 - 145 mmol/L 140   Potassium 3.5 - 5.1 mmol/L 4.3   Chloride 98 - 111 mmol/L 111   CO2 22 - 32 mmol/L 24   Calcium 8.9 - 10.3 mg/dL 9.2       Latest Ref Rng & Units 11/08/2021    9:27 AM  CBC  WBC 4.0 - 10.5 K/uL 4.8   Hemoglobin 12.0 - 15.0 g/dL 14.0   Hematocrit 36.0 - 46.0 % 41.2   Platelets 150 - 400 K/uL 245    External labs:   Labs done 05/15/2021:  BUN 9, creatinine 0.8, EGFR 99.6, potassium 4.8, LFTs normal.  Hb 13.1/HCT 39.0, platelets 256, normal indicis.  Total cholesterol 137, triglycerides 34, HDL 85, LDL 45.  TSH normal at 1.00, vitamin D 36.7.  Radiology:    Cardiac Studies:   Coronary calcium score 12/26/2017: Calcium score:  74.  LM: 0.  LAD: 74.  Cx: 0.  RCA:  0   Treadmill exercise stress test 03/04/2018 Indication: screening for CAD The patient exercised on Bruce protocol for  12:11 min. Patient achieved  13.60 METS and reached HR  182 bpm, which is  109 % of maximum age-predicted HR.  Stress test terminated due to fatigue. Resting EKG demonstrates Normal sinus rhythm. ST Changes: With peak exercise there was no ST-T changes of ischemia. Arrhythmias: none. Chest Pain: none. BP Response to Exercise: Normal resting BP- appropriate response. HR Response to Exercise: Appropriate.  Exercise capacity was above average for age. Continue primary/secondary prevention.  PCV MYOCARDIAL PERFUSION WITH LEXISCAN 12/07/2021  Narrative Lexiscan Nuclear stress test 12/07/2021: Nondiagnostic ECG stress. ECG demonstrated normal sinus rhythm with left bundle branch block. The heart rate and BP response was consistent with Regadenoson. Myocardial perfusion is abnormal. There is a fixed mild defect in the anterior and anteroseptal and apical region. Overall LV systolic function is moderately abnormal without regional wall motion abnormalities and there is global hypokinesis. Stress LV EF: 34%. Findings may be secondary to LBBB. Correlate with echocardiogram. No previous exam available for comparison. High risk study.   PCV ECHOCARDIOGRAM COMPLETE 11/23/2021  Narrative Echocardiogram 11/23/2021: Left ventricle cavity is normal in size. Mild concentric hypertrophy of the left ventricle. Abnormal septal wall motion due to left bundle branch block. Severe global hypokinesis. LVEF 25-30%. Apec not well visualized. Doppler evidence of grade I (impaired) diastolic dysfunction, normal LAP. Mild (Grade I) mitral regurgitation. Normal right atrial pressure.     EKG:   EKG 11/16/2021: Normal sinus rhythm at rate of 66 bpm, left bundle branch block.  No further analysis.  No significant change from 11/08/2021.  However, compared to  08/30/2017, left bundle branch block is new. Assessment     ICD-10-CM   1. Precordial chest pain  R07.2     2. LBBB (left bundle branch block)  I44.7     3. Chronic systolic heart failure (HCC)  U54.27 Basic metabolic panel    CBC    sacubitril-valsartan (ENTRESTO) 24-26 mg per tablet    carvedilol (COREG) 6.25 MG tablet    Brain natriuretic peptide    4. Elevated coronary artery calcium score 12/26/17 was74, 99 percentile in [place]  R93.1        Medications Discontinued During This Encounter  Medication Reason   Ascorbic Acid (VITA-C PO)    cholecalciferol (VITAMIN D) 25 MCG (1000 UNIT) tablet    cyanocobalamin 100 MCG tablet     Meds ordered this encounter  Medications   sacubitril-valsartan (ENTRESTO) 24-26 mg per tablet    Order Specific Question:   ACE-inhibitors have NOT been administered in the past 36-hours.    Answer:   YES (confirmed by ordering provider)   carvedilol (COREG) 6.25 MG tablet    Sig: Take 1 tablet (6.25 mg total) by mouth 2 (two) times daily.    Dispense:  180 tablet    Refill:  3   Orders Placed This Encounter  Procedures   Basic metabolic panel   CBC   Brain natriuretic peptide   Recommendations:   Monica Adams is a 61 y.o.  Caucasian female patient with multiple members in the family having had MI in their early 59 years of age (Mother with MI in early 34s. Brother at age 89 had 2 coronary stents. Both maternal grandparents with CAD in 28s), hyperlipidemia, elevated LPA,  coronary calcium scoring done on 12/26/2017 which was 29 and placed during the 99 percentile.  Patient was seen  in the emergency room on 11/08/2021 with chest pain radiating to the back, serum troponins and EKG were unremarkable and she was discharged home with recommendation to follow-up in the outpatient basis.  Although patient's symptoms are suggestive of musculoskeletal chest pain, she did get some relief with taking sublingual nitroglycerin in the ED, she has  continued to have occasional mild chest discomfort with exertional activities.  Hence angina pectoris with proximal LAD stenosis cannot be excluded especially in view of new left bundle branch block.    She underwent nuclear stress test and echocardiogram.  Nuclear stress test reveals fixed scar in the anterior wall probably related to LBBB and moderately reduced LVEF however the echocardiogram also reveals markedly reduced LVEF but global hypokinesis and septal motion consistent with LBBB.  Patient is not in any clinical heart failure.  I suspect she probably has nonischemic cardiomyopathy however cannot exclude LAD territory significant coronary disease.  In view of her strong family history, best option is to proceed with cardiac catheterization.  In the interim I will go ahead and start her on guideline directed medical therapy with carvedilol 6.25 mg twice daily (not an active heart failure) and also Entresto 24/26 mg twice daily.  She will follow-up with me in 2 weeks for up titration of medications with repeat labs including BMP, proBNP.  CBC ordered as she will be scheduled for cardiac catheterization.  Patient's husband is present.  All questions answered.  Schedule for right and left cardiac catheterization, and possible angioplasty. We discussed regarding risks, benefits, alternatives to this including stress testing, CTA and continued medical therapy. Patient wants to proceed. Understands <1-2% risk of death, stroke, MI, urgent CABG, bleeding, infection, renal failure but not limited to these.     Adrian Prows, MD, [facility] 12/28/2021, 6:02 PM Office: [phone]

## 2022-01-05 ENCOUNTER — Encounter: Payer: BC Managed Care – PPO | Admitting: Sports Medicine

## 2022-01-06 DIAGNOSIS — I5022 Chronic systolic (congestive) heart failure: Secondary | ICD-10-CM | POA: Diagnosis not present

## 2022-01-07 LAB — BASIC METABOLIC PANEL
BUN/Creatinine Ratio: 32 — ABNORMAL HIGH (ref 9–23)
BUN: 24 mg/dL (ref 6–24)
CO2: 19 mmol/L — ABNORMAL LOW (ref 20–29)
Calcium: 9.5 mg/dL (ref 8.7–10.2)
Chloride: 107 mmol/L — ABNORMAL HIGH (ref 96–106)
Creatinine, Ser: 0.76 mg/dL (ref 0.57–1.00)
Glucose: 102 mg/dL — ABNORMAL HIGH (ref 70–99)
Potassium: 4.6 mmol/L (ref 3.5–5.2)
Sodium: 140 mmol/L (ref 134–144)
eGFR: 91 mL/min/{1.73_m2} (ref >59)

## 2022-01-07 LAB — CBC
Hematocrit: 40.8 % (ref 34.0–46.6)
Hemoglobin: 14.1 g/dL (ref 11.1–15.9)
MCH: 31.7 pg (ref 26.6–33.0)
MCHC: 34.6 g/dL (ref 31.5–35.7)
MCV: 92 fL (ref 79–97)
Platelets: 283 10*3/uL (ref 150–450)
RBC: 4.45 x10E6/uL (ref 3.77–5.28)
RDW: 12.1 % (ref 11.7–15.4)
WBC: 5.7 10*3/uL (ref 3.4–10.8)

## 2022-01-07 LAB — BRAIN NATRIURETIC PEPTIDE: BNP: 43.2 pg/mL (ref 0.0–100.0)

## 2022-01-10 ENCOUNTER — Encounter (HOSPITAL_COMMUNITY)
Admission: RE | Disposition: A | Discharge status: Home / Self Care | Payer: Self-pay | Source: Ambulatory Visit | Attending: Cardiology

## 2022-01-10 ENCOUNTER — Other Ambulatory Visit: Payer: Self-pay

## 2022-01-10 ENCOUNTER — Ambulatory Visit (HOSPITAL_COMMUNITY)
Admission: RE | Admit: 2022-01-10 | Discharge: 2022-01-10 | Disposition: A | Discharge status: Home / Self Care | Payer: BC Managed Care – PPO | Source: Ambulatory Visit | Attending: Cardiology | Admitting: Cardiology

## 2022-01-10 DIAGNOSIS — R931 Abnormal findings on diagnostic imaging of heart and coronary circulation: Secondary | ICD-10-CM | POA: Diagnosis not present

## 2022-01-10 DIAGNOSIS — E7849 Other hyperlipidemia: Secondary | ICD-10-CM | POA: Insufficient documentation

## 2022-01-10 DIAGNOSIS — Z87891 Personal history of nicotine dependence: Secondary | ICD-10-CM | POA: Insufficient documentation

## 2022-01-10 DIAGNOSIS — I5022 Chronic systolic (congestive) heart failure: Secondary | ICD-10-CM | POA: Diagnosis not present

## 2022-01-10 DIAGNOSIS — I447 Left bundle-branch block, unspecified: Secondary | ICD-10-CM | POA: Insufficient documentation

## 2022-01-10 DIAGNOSIS — R079 Chest pain, unspecified: Secondary | ICD-10-CM

## 2022-01-10 DIAGNOSIS — Z8249 Family history of ischemic heart disease and other diseases of the circulatory system: Secondary | ICD-10-CM | POA: Diagnosis not present

## 2022-01-10 HISTORY — PX: RIGHT/LEFT HEART CATH AND CORONARY ANGIOGRAPHY: CATH118266

## 2022-01-10 LAB — POCT I-STAT EG7
Acid-base deficit: 1 mmol/L (ref 0.0–2.0)
Acid-base deficit: 1 mmol/L (ref 0.0–2.0)
Bicarbonate: 24.4 mmol/L (ref 20.0–28.0)
Bicarbonate: 24.6 mmol/L (ref 20.0–28.0)
Calcium, Ion: 1.3 mmol/L (ref 1.15–1.40)
Calcium, Ion: 1.32 mmol/L (ref 1.15–1.40)
HCT: 39 % (ref 36.0–46.0)
HCT: 39 % (ref 36.0–46.0)
Hemoglobin: 13.3 g/dL (ref 12.0–15.0)
Hemoglobin: 13.3 g/dL (ref 12.0–15.0)
O2 Saturation: 71 %
O2 Saturation: 73 %
Potassium: 4.1 mmol/L (ref 3.5–5.1)
Potassium: 4.1 mmol/L (ref 3.5–5.1)
Sodium: 141 mmol/L (ref 135–145)
Sodium: 142 mmol/L (ref 135–145)
TCO2: 26 mmol/L (ref 22–32)
TCO2: 26 mmol/L (ref 22–32)
pCO2, Ven: 43 mmHg — ABNORMAL LOW (ref 44–60)
pCO2, Ven: 43.4 mmHg — ABNORMAL LOW (ref 44–60)
pH, Ven: 7.361 (ref 7.25–7.43)
pH, Ven: 7.361 (ref 7.25–7.43)
pO2, Ven: 39 mmHg (ref 32–45)
pO2, Ven: 40 mmHg (ref 32–45)

## 2022-01-10 LAB — POCT I-STAT 7, (LYTES, BLD GAS, ICA,H+H)
Acid-base deficit: 2 mmol/L (ref 0.0–2.0)
Bicarbonate: 23.9 mmol/L (ref 20.0–28.0)
Calcium, Ion: 1.29 mmol/L (ref 1.15–1.40)
HCT: 38 % (ref 36.0–46.0)
Hemoglobin: 12.9 g/dL (ref 12.0–15.0)
O2 Saturation: 93 %
Potassium: 4 mmol/L (ref 3.5–5.1)
Sodium: 141 mmol/L (ref 135–145)
TCO2: 25 mmol/L (ref 22–32)
pCO2 arterial: 42.2 mmHg (ref 32–48)
pH, Arterial: 7.36 (ref 7.35–7.45)
pO2, Arterial: 70 mmHg — ABNORMAL LOW (ref 83–108)

## 2022-01-10 SURGERY — RIGHT/LEFT HEART CATH AND CORONARY ANGIOGRAPHY
Anesthesia: LOCAL

## 2022-01-10 MED ORDER — SODIUM CHLORIDE 0.9% FLUSH: 3.0000 mL | INTRAVENOUS | Status: DC | PRN

## 2022-01-10 MED ORDER — SODIUM CHLORIDE 0.9 % IV BOLUS
INTRAVENOUS | Status: DC | PRN
  Administered 2022-01-10: 400 mL via INTRAVENOUS

## 2022-01-10 MED ORDER — FENTANYL CITRATE (PF) 100 MCG/2ML IJ SOLN
INTRAMUSCULAR | Status: AC
  Filled 2022-01-10: qty 2

## 2022-01-10 MED ORDER — LIDOCAINE HCL (PF) 1 % IJ SOLN
INTRAMUSCULAR | Status: DC | PRN
  Administered 2022-01-10 (×2): 2 mL

## 2022-01-10 MED ORDER — LIDOCAINE HCL (PF) 1 % IJ SOLN
INTRAMUSCULAR | Status: AC
  Filled 2022-01-10: qty 30

## 2022-01-10 MED ORDER — SODIUM CHLORIDE 0.9 % WEIGHT BASED INFUSION
1.0000 mL/kg/h | INTRAVENOUS | Status: DC
Start: 2022-01-10 — End: 2022-01-11

## 2022-01-10 MED ORDER — SODIUM CHLORIDE 0.9 % IV SOLN: INTRAVENOUS | Status: DC

## 2022-01-10 MED ORDER — FENTANYL CITRATE (PF) 100 MCG/2ML IJ SOLN
INTRAMUSCULAR | Status: DC | PRN
  Administered 2022-01-10: 25 ug via INTRAVENOUS

## 2022-01-10 MED ORDER — HEPARIN (PORCINE) IN NACL 1000-0.9 UT/500ML-% IV SOLN
INTRAVENOUS | Status: DC | PRN
  Administered 2022-01-10 (×2): 500 mL

## 2022-01-10 MED ORDER — SODIUM CHLORIDE 0.9 % IV SOLN: 250.0000 mL | INTRAVENOUS | Status: DC | PRN

## 2022-01-10 MED ORDER — VERAPAMIL HCL 2.5 MG/ML IV SOLN
INTRAVENOUS | Status: AC
Start: 2022-01-10 — End: ?
  Filled 2022-01-10: qty 2

## 2022-01-10 MED ORDER — IOHEXOL 350 MG/ML SOLN
INTRAVENOUS | Status: DC | PRN
  Administered 2022-01-10: 40 mL

## 2022-01-10 MED ORDER — SODIUM CHLORIDE 0.9% FLUSH: 3.0000 mL | Freq: Two times a day (BID) | INTRAVENOUS | Status: DC

## 2022-01-10 MED ORDER — MIDAZOLAM HCL 2 MG/2ML IJ SOLN
INTRAMUSCULAR | Status: AC
  Filled 2022-01-10: qty 2

## 2022-01-10 MED ORDER — MIDAZOLAM HCL 2 MG/2ML IJ SOLN
INTRAMUSCULAR | Status: DC | PRN
  Administered 2022-01-10: 2 mg via INTRAVENOUS

## 2022-01-10 MED ORDER — ONDANSETRON HCL 4 MG/2ML IJ SOLN: 4.0000 mg | Freq: Four times a day (QID) | INTRAMUSCULAR | Status: DC | PRN

## 2022-01-10 MED ORDER — HEPARIN SODIUM (PORCINE) 1000 UNIT/ML IJ SOLN
INTRAMUSCULAR | Status: AC
  Filled 2022-01-10: qty 10

## 2022-01-10 MED ORDER — VERAPAMIL HCL 2.5 MG/ML IV SOLN
INTRAVENOUS | Status: DC | PRN
  Administered 2022-01-10: 4 mL via INTRA_ARTERIAL

## 2022-01-10 MED ORDER — ACETAMINOPHEN 325 MG PO TABS
650.0000 mg | ORAL_TABLET | ORAL | Status: DC | PRN
Start: 2022-01-10 — End: 2022-01-11

## 2022-01-10 MED ORDER — HEPARIN (PORCINE) IN NACL 1000-0.9 UT/500ML-% IV SOLN
INTRAVENOUS | Status: AC
Start: 2022-01-10 — End: ?
  Filled 2022-01-10: qty 1000

## 2022-01-10 MED ORDER — HEPARIN SODIUM (PORCINE) 1000 UNIT/ML IJ SOLN
INTRAMUSCULAR | Status: DC | PRN
  Administered 2022-01-10: 3500 [IU] via INTRAVENOUS

## 2022-01-10 MED ORDER — ASPIRIN 81 MG PO CHEW: 81.0000 mg | CHEWABLE_TABLET | ORAL | Status: DC

## 2022-01-10 SURGICAL SUPPLY — 14 items
BAND CMPR LRG ZPHR (HEMOSTASIS) ×1
BAND ZEPHYR COMPRESS 30 LONG (HEMOSTASIS) ×1 IMPLANT
CATH BALLN WEDGE 5F 110CM (CATHETERS) ×1 IMPLANT
CATH INFINITI JR4 5F (CATHETERS) ×1 IMPLANT
CATH OPTITORQUE TIG 4.0 5F (CATHETERS) ×1 IMPLANT
GLIDESHEATH SLEND A-KIT 6F 22G (SHEATH) ×1 IMPLANT
GUIDEWIRE .025 260CM (WIRE) ×1 IMPLANT
GUIDEWIRE INQWIRE 1.5J.035X260 (WIRE) IMPLANT
INQWIRE 1.5J .035X260CM (WIRE) ×2
KIT HEART LEFT (KITS) ×2 IMPLANT
PACK CARDIAC CATHETERIZATION (CUSTOM PROCEDURE TRAY) ×2 IMPLANT
SHEATH GLIDE SLENDER 4/5FR (SHEATH) ×1 IMPLANT
TRANSDUCER W/STOPCOCK (MISCELLANEOUS) ×2 IMPLANT
TUBING CIL FLEX 10 FLL-RA (TUBING) ×2 IMPLANT

## 2022-01-10 NOTE — Interval H&P Note (Signed)
History and Physical Interval Note:  01/10/2022 4:27 PM  Monica Adams  has presented today for surgery, with the diagnosis of positive stress test.  The various methods of treatment have been discussed with the patient and family. After consideration of risks, benefits and other options for treatment, the patient has consented to  Procedure(s): RIGHT/LEFT HEART CATH AND CORONARY ANGIOGRAPHY (N/A) and possible angioplasty as a surgical intervention.  The patient's history has been reviewed, patient examined, no change in status, stable for surgery.  I have reviewed the patient's chart and labs.  Questions were answered to the patient's satisfaction.    Cath Lab Visit (complete for each Cath Lab visit)  Clinical Evaluation Leading to the Procedure:   ACS: No.  Non-ACS:    Anginal Classification: CCS II  Anti-ischemic medical therapy: Minimal Therapy (1 class of medications)  Non-Invasive Test Results: High-risk stress test findings: cardiac mortality >3%/year  Prior CABG: No previous CABG  Yates Decamp

## 2022-01-11 ENCOUNTER — Encounter (HOSPITAL_COMMUNITY): Payer: Self-pay | Admitting: Cardiology

## 2022-01-19 ENCOUNTER — Other Ambulatory Visit: Payer: Self-pay | Admitting: Podiatry

## 2022-01-25 ENCOUNTER — Ambulatory Visit: Payer: BC Managed Care – PPO | Admitting: Cardiology

## 2022-01-25 ENCOUNTER — Encounter: Payer: Self-pay | Admitting: Cardiology

## 2022-01-25 VITALS — BP 126/75 | HR 63 | Temp 98.0°F | Resp 16 | Ht 66.0 in | Wt 154.0 lb

## 2022-01-25 DIAGNOSIS — R0683 Snoring: Secondary | ICD-10-CM | POA: Diagnosis not present

## 2022-01-25 DIAGNOSIS — I428 Other cardiomyopathies: Secondary | ICD-10-CM | POA: Diagnosis not present

## 2022-01-25 DIAGNOSIS — I447 Left bundle-branch block, unspecified: Secondary | ICD-10-CM

## 2022-01-25 DIAGNOSIS — I5022 Chronic systolic (congestive) heart failure: Secondary | ICD-10-CM | POA: Diagnosis not present

## 2022-01-25 MED ORDER — SACUBITRIL-VALSARTAN 49-51 MG PO TABS: 1.0000 | ORAL_TABLET | Freq: Two times a day (BID) | ORAL | Status: DC

## 2022-01-25 MED ORDER — CARVEDILOL 12.5 MG PO TABS: 6.2500 mg | ORAL_TABLET | Freq: Two times a day (BID) | ORAL | 1 refills | Status: DC

## 2022-01-25 NOTE — Progress Notes (Signed)
Primary Physician/Referring:  Crist Infante, MD  Patient ID: Monica Adams, female    DOB: 1960-11-01, 61 y.o.   MRN: [id number]  Chief Complaint  Patient presents with   Cardiomyopathy   Abnormal ECG   Follow-up    2 weeks    HPI:    Monica Adams  is a 61 y.o. Caucasian female patient with multiple members in the family having had MI in their early 47 years of age (Mother with MI in early 75s. Brother at age 35 had 2 coronary stents. Both maternal grandparents with CAD in 6s), hyperlipidemia, elevated LPA,  coronary calcium scoring done on 12/26/2017 in the 99 percentile.  Patient was seen in the emergency room on 11/08/2021 with chest pain radiating to the back, serum troponins and EKG were unremarkable and she was discharged home with recommendation to follow-up in the outpatient basis.  Found to have new onset LBBB, new onset severe LV systolic dysfunction with EF of 25 to 35%, cardiac catheterization 12/31/2021 revealing very mild calcific plaque in the proximal LAD otherwise normal coronary arteries with EF 35% with global hypokinesis.    She now presents in the office for follow-up, tolerating low-dose Entresto.  He continues to have episodes of chest pain sometimes with exertion sometimes at rest.  She also complains of being fatigued and tired.  Past Medical History:  Diagnosis Date   Hyperlipidemia    Osteoporosis    Past Surgical History:  Procedure Laterality Date   kidney stone removal     RIGHT/LEFT HEART CATH AND CORONARY ANGIOGRAPHY N/A 01/10/2022   Procedure: RIGHT/LEFT HEART CATH AND CORONARY ANGIOGRAPHY;  Surgeon: Adrian Prows, MD;  Location: [facility];  Service: Cardiovascular;  Laterality: N/A;   Family History  Problem Relation Age of Onset   Heart attack Mother 69   Heart disease Mother    Kidney disease Mother    Alzheimer's disease Father    Diabetes Brother    Heart attack Maternal Grandmother 57   Heart disease Maternal Grandfather 82    Heart disease Half-Brother 81       stents    Social History   Tobacco Use   Smoking status: Former    Packs/day: 0.25    Years: 4.00    Total pack years: 1.00    Types: Cigarettes    Quit date: 1985    Years since quitting: 38.6   Smokeless tobacco: Never   Tobacco comments:    Smoked in 20's for about 4 years  Substance Use Topics   Alcohol use: Yes    Alcohol/week: 2.0 standard drinks of alcohol    Types: 1 Glasses of wine, 1 Shots of liquor per week    Comment: daily 1-2 glasses   Marital Status: Married  ROS  Review of Systems  Constitutional: Positive for malaise/fatigue.  Cardiovascular:  Positive for chest pain. Negative for dyspnea on exertion and leg swelling.  Respiratory:  Positive for snoring.    Objective  Blood pressure 126/75, pulse 63, temperature 98 F (36.7 C), temperature source Temporal, resp. rate 16, height 5' 6" (1.676 m), weight 154 lb (69.9 kg), SpO2 99 %. Body mass index is 24.86 kg/m.     01/25/2022   10:50 AM 01/10/2022    7:20 PM 01/10/2022    6:50 PM  Vitals with BMI  Height 5' 6"    Weight 154 lbs    BMI 25.42    Systolic 706 237 628  Diastolic 75 72  68  Pulse 63 64 66    Physical Exam Neck:     Vascular: No JVD.  Cardiovascular:     Rate and Rhythm: Normal rate and regular rhythm.     Pulses: Intact distal pulses.     Heart sounds: Murmur heard.     Early systolic murmur is present with a grade of 2/6 at the upper right sternal border.     No gallop.  Pulmonary:     Effort: Pulmonary effort is normal.     Breath sounds: Normal breath sounds.  Abdominal:     General: Bowel sounds are normal.     Palpations: Abdomen is soft.  Musculoskeletal:     Right lower leg: No edema.     Left lower leg: No edema.     Medications and allergies  No Known Allergies   Medication list after today's encounter   Current Outpatient Medications:    ibandronate (BONIVA) 150 MG tablet, Take 150 mg by mouth every 30 (thirty) days., Disp: ,  Rfl:    Multiple Vitamins-Minerals (ONE A DAY WOMEN 50 PLUS PO), Take 1 tablet by mouth daily., Disp: , Rfl:    nitroGLYCERIN (NITROSTAT) 0.4 MG SL tablet, Place 1 tablet (0.4 mg total) under the tongue every 5 (five) minutes as needed for up to 4 doses for chest pain., Disp: 4 tablet, Rfl: 0   REPATHA SURECLICK 664 MG/ML SOAJ, Inject 140 mg into the skin every 14 (fourteen) days., Disp: , Rfl:    rosuvastatin (CRESTOR) 20 MG tablet, Take 20 mg by mouth daily., Disp: , Rfl:    carvedilol (COREG) 12.5 MG tablet, Take 0.5 tablets (6.25 mg total) by mouth 2 (two) times daily., Disp: 180 tablet, Rfl: 1  Current Facility-Administered Medications:    sacubitril-valsartan (ENTRESTO) 49-51 mg per tablet, 1 tablet, Oral, BID, Adrian Prows, MD  Laboratory examination:   Recent Labs    11/08/21 0927 01/06/22 1540 01/10/22 1656 01/10/22 1657 01/10/22 1702  NA 140 140 141 142 141  K 4.3 4.6 4.1 4.1 4.0  CL 111 107*  --   --   --   CO2 24 19*  --   --   --   GLUCOSE 102* 102*  --   --   --   BUN 19 24  --   --   --   CREATININE 0.71 0.76  --   --   --   CALCIUM 9.2 9.5  --   --   --   GFRNONAA >60  --   --   --   --    estimated creatinine clearance is 71.8 mL/min (by C-G formula based on SCr of 0.76 mg/dL).     Latest Ref Rng & Units 01/10/2022    5:02 PM 01/10/2022    4:57 PM 01/10/2022    4:56 PM  CMP  Sodium 135 - 145 mmol/L 141  142  141   Potassium 3.5 - 5.1 mmol/L 4.0  4.1  4.1       Latest Ref Rng & Units 01/10/2022    5:02 PM 01/10/2022    4:57 PM 01/10/2022    4:56 PM  CBC  Hemoglobin 12.0 - 15.0 g/dL 12.9  13.3  13.3   Hematocrit 36.0 - 46.0 % 38.0  39.0  39.0    BNP (last 3 results) Recent Labs    01/06/22 1540  BNP 43.2    External labs:   Labs done 05/15/2021:  BUN 9, creatinine 0.8,  EGFR 99.6, potassium 4.8, LFTs normal.  Hb 13.1/HCT 39.0, platelets 256, normal indicis.  Total cholesterol 137, triglycerides 34, HDL 85, LDL 45.  TSH normal at 1.00, vitamin D  36.7.  Radiology:    Cardiac Studies:   Coronary calcium score 12/26/2017: Calcium score: 74.  LM: 0.  LAD: 74.  Cx: 0.  RCA: 0   Treadmill exercise stress test 03/04/2018 Indication: screening for CAD The patient exercised on Bruce protocol for  12:11 min. Patient achieved  13.60 METS and reached HR  182 bpm, which is  109 % of maximum age-predicted HR.  Stress test terminated due to fatigue. Resting EKG demonstrates Normal sinus rhythm. ST Changes: With peak exercise there was no ST-T changes of ischemia. Arrhythmias: none. Chest Pain: none. BP Response to Exercise: Normal resting BP- appropriate response. HR Response to Exercise: Appropriate.  Exercise capacity was above average for age. Continue primary/secondary prevention.  PCV MYOCARDIAL PERFUSION WITH LEXISCAN 12/07/2021  Narrative Lexiscan Nuclear stress test 12/07/2021: Nondiagnostic ECG stress. ECG demonstrated normal sinus rhythm with left bundle branch block. The heart rate and BP response was consistent with Regadenoson. Myocardial perfusion is abnormal. There is a fixed mild defect in the anterior and anteroseptal and apical region. Overall LV systolic function is moderately abnormal without regional wall motion abnormalities and there is global hypokinesis. Stress LV EF: 34%. Findings may be secondary to LBBB. Correlate with echocardiogram. No previous exam available for comparison. High risk study.   PCV ECHOCARDIOGRAM COMPLETE 11/23/2021  Narrative Echocardiogram 11/23/2021: Left ventricle cavity is normal in size. Mild concentric hypertrophy of the left ventricle. Abnormal septal wall motion due to left bundle branch block. Severe global hypokinesis. LVEF 25-30%. Apec not well visualized. Doppler evidence of grade I (impaired) diastolic dysfunction, normal LAP. Mild (Grade I) mitral regurgitation. Normal right atrial pressure.    Right & Left Heart Catheterization 01/10/22: RA 3/2/, mean 0 mmHg. RV 16/-3,  EDP 12 mmHg. PA 17/2, mean 8 mmHg.  PA saturation 72%. PW 7/6, mean 5 mmHg. LV: 25/2, EDP 19 mmHg.  Ao 129/63, mean 86 mmHg.  Aortic saturation 93%. CO 5.92, CI 3.3 by Fick.  QP/QS 1.00.  LV: Global hypokinesis.  EF 35% approximately. LM: Short.  Bifurcates into LAD and CX. LAD: Very large caliber vessel, smooth and normal.  Gives origin to 3 large diagonals.  Proximal to mid segment of the LAD has a calcific shelf on the inferior margin without any luminal obstruction.  LAD itself is very smooth and normal otherwise. CX: Dominant vessel.  Gives origin to moderate-sized OM1 and 2 and continues in the AV groove and gives origin to PDA branches.  Smooth and normal. RCA: Small and nondominant.  Impression: Normal right heart catheterization with preserved cardiac output and cardiac index.  Findings are consistent with nonischemic cardiomyopathy.  40 mL contrast utilized.  Sedation time 25 minutes.  EKG:   EKG 11/16/2021: Normal sinus rhythm at rate of 66 bpm, left bundle branch block.  No further analysis.  No significant change from 11/08/2021.  However, compared to 08/30/2017, left bundle branch block is new. Assessment     ICD-10-CM   1. LBBB (left bundle branch block)  I44.7 Ambulatory referral to Cardiology    2. Chronic systolic heart failure (HCC)  I50.22 sacubitril-valsartan (ENTRESTO) 49-51 mg per tablet    carvedilol (COREG) 12.5 MG tablet    Basic metabolic panel    Ambulatory referral to Sleep Studies    Ambulatory referral to Cardiology  3. Non-ischemic cardiomyopathy ([place])  I42.8 Ambulatory referral to Cardiology    4. Snoring  R06.83 Ambulatory referral to Sleep Studies       Medications Discontinued During This Encounter  Medication Reason   sacubitril-valsartan (ENTRESTO) 24-26 MG Dose change   carvedilol (COREG) 6.25 MG tablet Reorder    Meds ordered this encounter  Medications   sacubitril-valsartan (ENTRESTO) 49-51 mg per tablet    Order Specific Question:    ACE-inhibitors have NOT been administered in the past 36-hours.    Answer:   YES (confirmed by ordering provider)   carvedilol (COREG) 12.5 MG tablet    Sig: Take 0.5 tablets (6.25 mg total) by mouth 2 (two) times daily.    Dispense:  180 tablet    Refill:  1   Orders Placed This Encounter  Procedures   Basic metabolic panel   Ambulatory referral to Sleep Studies    Referral Priority:   Routine    Referral Type:   Consultation    Referral Reason:   Specialty Services Required    Referred to Provider:   Larey Seat, MD    Number of Visits Requested:   1   Ambulatory referral to Cardiology    Referral Priority:   Routine    Referral Type:   Consultation    Referral Reason:   Specialty Services Required    Requested Specialty:   Cardiology    Number of Visits Requested:   1   Recommendations:   BETRICE WANAT is a 61 y.o.  Caucasian female patient with multiple members in the family having had MI in their early 73 years of age (Mother with MI in early 52s. Brother at age 39 had 2 coronary stents. Both maternal grandparents with CAD in 71s), hyperlipidemia, elevated LPA,  coronary calcium scoring done on 12/26/2017 in the 99 percentile.  Patient was seen in the emergency room on 11/08/2021 with chest pain radiating to the back, serum troponins and EKG were unremarkable and she was discharged home with recommendation to follow-up in the outpatient basis.  Found to have new onset LBBB, new onset severe LV systolic dysfunction with EF of 25 to 35%, cardiac catheterization 12/31/2021 revealing very mild calcific plaque in the proximal LAD otherwise normal coronary arteries with EF 35% with global hypokinesis.    She now presents in the office for follow-up, tolerating low-dose Entresto.  I will increase the dose to 49/51 mg twice daily and Coreg from 6.25 mg to 12.5 mg twice daily, will try to uptitrate her medications to the maximum as tolerated.  In spite of new onset of LBBB and  cardiomyopathy, no clinical signs or symptoms to suggest acute decompensated heart failure, BNP is normal.  Extensive discussion with the patient that therapy would involve management of chronic nonischemic cardiomyopathy with guideline directed medical therapy.  She does not need an ICD as she does not have any regionality for her LV systolic dysfunction.  Also does not need BiV pacemaker as she has no heart failure symptoms or signs.  Will increase Entresto to 49/51 mg dose, Coreg to 12.5 mg twice daily incrementally.  Well-controlled, chest pain symptoms may be noncardiac and GERD could be playing a role.  Extremely anxious, I tried to reassure her.  She may benefit from antianxiety/depression medications for short-term.  We will make a referral to [facility] cardiomyopathy clinic.  He has daytime fatigue and somnolence, snoring, in view of chronic systolic heart failure, would also like to  exclude both central and obstructive sleep apnea, will refer her to Dr., Dohmeier for sleep evaluation.  I would like to see her back in 4 to 6 weeks for follow-up, if she tolerates Entresto, will increase it to highest dose of 97/103 mg and she will message [place].  This was a >40-minute office visit encounter and discussions regarding management of advanced heart failure, options for medical therapy, coordination of care.   Adrian Prows, MD, [facility] 01/25/2022, 10:15 PM Office: [phone]

## 2022-02-05 ENCOUNTER — Encounter: Payer: Self-pay | Admitting: Cardiology

## 2022-02-05 DIAGNOSIS — I5022 Chronic systolic (congestive) heart failure: Secondary | ICD-10-CM

## 2022-02-06 MED ORDER — ENTRESTO 97-103 MG PO TABS: 1.0000 | ORAL_TABLET | Freq: Two times a day (BID) | ORAL | 3 refills | Status: DC

## 2022-02-06 NOTE — Telephone Encounter (Signed)
Please advise on dosage of entresto.

## 2022-02-06 NOTE — Telephone Encounter (Signed)
ICD-10-CM   1. Chronic systolic heart failure (HCC)  M78.67 sacubitril-valsartan (ENTRESTO) 97-103 MG     Medications Discontinued During This Encounter  Medication Reason   sacubitril-valsartan (ENTRESTO) 49-51 mg per tablet     Meds ordered this encounter  Medications   sacubitril-valsartan (ENTRESTO) 97-103 MG    Sig: Take 1 tablet by mouth 2 (two) times daily.    Dispense:  180 tablet    Refill:  3

## 2022-02-21 DIAGNOSIS — I5022 Chronic systolic (congestive) heart failure: Secondary | ICD-10-CM | POA: Diagnosis not present

## 2022-02-22 LAB — BASIC METABOLIC PANEL
BUN/Creatinine Ratio: 27 — ABNORMAL HIGH (ref 9–23)
BUN: 19 mg/dL (ref 6–24)
CO2: 25 mmol/L (ref 20–29)
Calcium: 10.3 mg/dL — ABNORMAL HIGH (ref 8.7–10.2)
Chloride: 102 mmol/L (ref 96–106)
Creatinine, Ser: 0.7 mg/dL (ref 0.57–1.00)
Glucose: 139 mg/dL — ABNORMAL HIGH (ref 70–99)
Potassium: 4.3 mmol/L (ref 3.5–5.2)
Sodium: 138 mmol/L (ref 134–144)
eGFR: 100 mL/min/{1.73_m2} (ref >59)

## 2022-03-02 ENCOUNTER — Encounter: Payer: Self-pay | Admitting: Cardiology

## 2022-03-03 ENCOUNTER — Other Ambulatory Visit: Payer: Self-pay

## 2022-03-03 DIAGNOSIS — I5022 Chronic systolic (congestive) heart failure: Secondary | ICD-10-CM

## 2022-03-03 MED ORDER — CARVEDILOL 12.5 MG PO TABS: 12.5000 mg | ORAL_TABLET | Freq: Two times a day (BID) | ORAL | 1 refills | Status: DC

## 2022-03-03 MED ORDER — CARVEDILOL 12.5 MG PO TABS: 12.5000 mg | ORAL_TABLET | Freq: Two times a day (BID) | ORAL | 2 refills | Status: DC

## 2022-03-08 ENCOUNTER — Encounter: Payer: Self-pay | Admitting: Cardiology

## 2022-03-08 ENCOUNTER — Ambulatory Visit: Payer: BC Managed Care – PPO | Admitting: Cardiology

## 2022-03-08 VITALS — BP 116/74 | HR 60 | Temp 97.8°F | Resp 16 | Ht 66.0 in | Wt 155.0 lb

## 2022-03-08 DIAGNOSIS — Z8249 Family history of ischemic heart disease and other diseases of the circulatory system: Secondary | ICD-10-CM | POA: Diagnosis not present

## 2022-03-08 DIAGNOSIS — I428 Other cardiomyopathies: Secondary | ICD-10-CM | POA: Diagnosis not present

## 2022-03-08 DIAGNOSIS — I5022 Chronic systolic (congestive) heart failure: Secondary | ICD-10-CM | POA: Diagnosis not present

## 2022-03-08 DIAGNOSIS — I447 Left bundle-branch block, unspecified: Secondary | ICD-10-CM | POA: Diagnosis not present

## 2022-03-08 NOTE — Progress Notes (Signed)
Primary Physician/Referring:  Crist Infante, MD  Patient ID: Monica Adams, female    DOB: 12-18-1960, 61 y.o.   MRN: [id number]  No chief complaint on file.   HPI:    Monica Adams  is a 61 y.o. Caucasian female patient with multiple members in the family having had MI in their early 47 years of age (Mother with MI in early 41s. Brother at age 40 had 2 coronary stents. Both maternal grandparents with CAD in 52s), hyperlipidemia, elevated LPA,  coronary calcium scoring done on 12/26/2017 in the 99 percentile.  Patient was seen in the emergency room on 11/08/2021 with chest pain radiating to the back, serum troponins and EKG were unremarkable and she was discharged home with recommendation to follow-up in the outpatient basis.  Found to have new onset LBBB, new onset severe LV systolic dysfunction with EF of 25 to 35%, cardiac catheterization 12/31/2021 revealing very mild calcific plaque in the proximal LAD otherwise normal coronary arteries with EF 35% with global hypokinesis.    She has been referred to [facility] cardiomyopathy clinic, seeing them on March 31, 2022.  She has occasional episodes of chest pain with exertion.  Overall states that she is doing well and fatigue is improved, she thinks that she is back to her baseline.  No PND or orthopnea, no dyspnea, no leg edema.  Past Medical History:  Diagnosis Date   Hyperlipidemia    Osteoporosis    Past Surgical History:  Procedure Laterality Date   kidney stone removal     RIGHT/LEFT HEART CATH AND CORONARY ANGIOGRAPHY N/A 01/10/2022   Procedure: RIGHT/LEFT HEART CATH AND CORONARY ANGIOGRAPHY;  Surgeon: Adrian Prows, MD;  Location: [facility];  Service: Cardiovascular;  Laterality: N/A;   Family History  Problem Relation Age of Onset   Heart attack Mother 68   Heart disease Mother    Kidney disease Mother    Alzheimer's disease Father    Diabetes Brother    Heart attack Maternal Grandmother 34    Heart disease Maternal Grandfather 54   Heart disease Half-Brother 46       stents    Social History   Tobacco Use   Smoking status: Former    Packs/day: 0.25    Years: 4.00    Total pack years: 1.00    Types: Cigarettes    Quit date: 1985    Years since quitting: 38.7   Smokeless tobacco: Never   Tobacco comments:    Smoked in 20's for about 4 years  Substance Use Topics   Alcohol use: Yes    Alcohol/week: 2.0 standard drinks of alcohol    Types: 1 Glasses of wine, 1 Shots of liquor per week    Comment: daily 1-2 glasses   Marital Status: Married  ROS  Review of Systems  Constitutional: Negative for malaise/fatigue.  Cardiovascular:  Positive for chest pain. Negative for dyspnea on exertion and leg swelling.  Respiratory:  Positive for snoring.    Objective  Blood pressure 116/74, pulse 60, temperature 97.8 F (36.6 C), temperature source Temporal, resp. rate 16, height 5' 6"  (1.676 m), weight 155 lb (70.3 kg), SpO2 99 %. Body mass index is 25.02 kg/m.     03/08/2022    3:30 PM 01/25/2022   10:50 AM 01/10/2022    7:20 PM  Vitals with BMI  Height 5' 6"  5' 6"    Weight 155 lbs 154 lbs   BMI 25.03 24.87  Systolic 671 245 809  Diastolic 74 75 72  Pulse 60 63 64    Physical Exam Neck:     Vascular: No JVD.  Cardiovascular:     Rate and Rhythm: Normal rate and regular rhythm.     Pulses: Intact distal pulses.     Heart sounds: Murmur heard.     Early systolic murmur is present with a grade of 2/6 at the upper right sternal border.     No gallop.  Pulmonary:     Effort: Pulmonary effort is normal.     Breath sounds: Normal breath sounds.  Abdominal:     General: Bowel sounds are normal.     Palpations: Abdomen is soft.  Musculoskeletal:     Right lower leg: No edema.     Left lower leg: No edema.     Medications and allergies  No Known Allergies   Medication list after today's encounter   Current Outpatient Medications:    carvedilol (COREG) 12.5 MG  tablet, Take 1 tablet (12.5 mg total) by mouth 2 (two) times daily., Disp: 180 tablet, Rfl: 2   ibandronate (BONIVA) 150 MG tablet, Take 150 mg by mouth every 30 (thirty) days., Disp: , Rfl:    Multiple Vitamins-Minerals (ONE A DAY WOMEN 50 PLUS PO), Take 1 tablet by mouth daily., Disp: , Rfl:    nitroGLYCERIN (NITROSTAT) 0.4 MG SL tablet, Place 1 tablet (0.4 mg total) under the tongue every 5 (five) minutes as needed for up to 4 doses for chest pain., Disp: 4 tablet, Rfl: 0   REPATHA SURECLICK 983 MG/ML SOAJ, Inject 140 mg into the skin every 14 (fourteen) days., Disp: , Rfl:    rosuvastatin (CRESTOR) 20 MG tablet, Take 20 mg by mouth daily., Disp: , Rfl:    sacubitril-valsartan (ENTRESTO) 97-103 MG, Take 1 tablet by mouth 2 (two) times daily., Disp: 180 tablet, Rfl: 3  Laboratory examination:   Recent Labs    11/08/21 0927 01/06/22 1540 01/10/22 1656 01/10/22 1657 01/10/22 1702 02/21/22 1509  NA 140 140   < > 142 141 138  K 4.3 4.6   < > 4.1 4.0 4.3  CL 111 107*  --   --   --  102  CO2 24 19*  --   --   --  25  GLUCOSE 102* 102*  --   --   --  139*  BUN 19 24  --   --   --  19  CREATININE 0.71 0.76  --   --   --  0.70  CALCIUM 9.2 9.5  --   --   --  10.3*  GFRNONAA >60  --   --   --   --   --    < > = values in this interval not displayed.   estimated creatinine clearance is 71.8 mL/min (by C-G formula based on SCr of 0.7 mg/dL).     Latest Ref Rng & Units 02/21/2022    3:09 PM 01/10/2022    5:02 PM 01/10/2022    4:57 PM  CMP  Glucose 70 - 99 mg/dL 139     BUN 6 - 24 mg/dL 19     Creatinine 0.57 - 1.00 mg/dL 0.70     Sodium 134 - 144 mmol/L 138  141  142   Potassium 3.5 - 5.2 mmol/L 4.3  4.0  4.1   Chloride 96 - 106 mmol/L 102     CO2 20 - 29 mmol/L 25  Calcium 8.7 - 10.2 mg/dL 10.3         Latest Ref Rng & Units 01/10/2022    5:02 PM 01/10/2022    4:57 PM 01/10/2022    4:56 PM  CBC  Hemoglobin 12.0 - 15.0 g/dL 12.9  13.3  13.3   Hematocrit 36.0 - 46.0 % 38.0  39.0   39.0    BNP (last 3 results) Recent Labs    01/06/22 1540  BNP 43.2    External labs:   Labs done 05/15/2021:  BUN 9, creatinine 0.8, EGFR 99.6, potassium 4.8, LFTs normal.  Hb 13.1/HCT 39.0, platelets 256, normal indicis.  Total cholesterol 137, triglycerides 34, HDL 85, LDL 45.  TSH normal at 1.00, vitamin D 36.7.  Radiology:    Cardiac Studies:   Coronary calcium score 12/26/2017: Calcium score: 74.  LM: 0.  LAD: 74.  Cx: 0.  RCA: 0   Treadmill exercise stress test 03/04/2018 Indication: screening for CAD The patient exercised on Bruce protocol for  12:11 min. Patient achieved  13.60 METS and reached HR  182 bpm, which is  109 % of maximum age-predicted HR.  Stress test terminated due to fatigue. Resting EKG demonstrates Normal sinus rhythm. ST Changes: With peak exercise there was no ST-T changes of ischemia. Arrhythmias: none. Chest Pain: none. BP Response to Exercise: Normal resting BP- appropriate response. HR Response to Exercise: Appropriate.  Exercise capacity was above average for age. Continue primary/secondary prevention.  PCV MYOCARDIAL PERFUSION WITH LEXISCAN 12/07/2021  Narrative Lexiscan Nuclear stress test 12/07/2021: Nondiagnostic ECG stress. ECG demonstrated normal sinus rhythm with left bundle branch block. The heart rate and BP response was consistent with Regadenoson. Myocardial perfusion is abnormal. There is a fixed mild defect in the anterior and anteroseptal and apical region. Overall LV systolic function is moderately abnormal without regional wall motion abnormalities and there is global hypokinesis. Stress LV EF: 34%. Findings may be secondary to LBBB. Correlate with echocardiogram. No previous exam available for comparison. High risk study.   PCV ECHOCARDIOGRAM COMPLETE 11/23/2021  Narrative Echocardiogram 11/23/2021: Left ventricle cavity is normal in size. Mild concentric hypertrophy of the left ventricle. Abnormal septal wall  motion due to left bundle branch block. Severe global hypokinesis. LVEF 25-30%. Apec not well visualized. Doppler evidence of grade I (impaired) diastolic dysfunction, normal LAP. Mild (Grade I) mitral regurgitation. Normal right atrial pressure.    Right & Left Heart Catheterization 01/10/22: RA 3/2/, mean 0 mmHg. RV 16/-3, EDP 12 mmHg. PA 17/2, mean 8 mmHg.  PA saturation 72%. PW 7/6, mean 5 mmHg. LV: 25/2, EDP 19 mmHg.  Ao 129/63, mean 86 mmHg.  Aortic saturation 93%. CO 5.92, CI 3.3 by Fick.  QP/QS 1.00.  LV: Global hypokinesis.  EF 35% approximately. LM: Short.  Bifurcates into LAD and CX. LAD: Very large caliber vessel, smooth and normal.  Gives origin to 3 large diagonals.  Proximal to mid segment of the LAD has a calcific shelf on the inferior margin without any luminal obstruction.  LAD itself is very smooth and normal otherwise. CX: Dominant vessel.  Gives origin to moderate-sized OM1 and 2 and continues in the AV groove and gives origin to PDA branches.  Smooth and normal. RCA: Small and nondominant.  Impression: Normal right heart catheterization with preserved cardiac output and cardiac index.  Findings are consistent with nonischemic cardiomyopathy.  40 mL contrast utilized.  Sedation time 25 minutes.  EKG:   EKG 11/16/2021: Normal sinus rhythm at rate of 66 bpm,  left bundle branch block.  No further analysis.  No significant change from 11/08/2021.  However, compared to 08/30/2017, left bundle branch block is new. Assessment     ICD-10-CM   1. Chronic systolic heart failure (HCC)  I50.22     2. Non-ischemic cardiomyopathy ([place])  I42.8     3. LBBB (left bundle branch block)  I44.7        There are no discontinued medications.   No orders of the defined types were placed in this encounter.  No orders of the defined types were placed in this encounter.  Recommendations:   CLARIECE ROESLER is a 61 y.o.  Caucasian female patient with multiple members in the family  having had MI in their early 7 years of age (Mother with MI in early 35s. Brother at age 58 had 2 coronary stents. Both maternal grandparents with CAD in 40s), hyperlipidemia, elevated LPA,  coronary calcium scoring done on 12/26/2017 in the 99 percentile.  Patient was seen in the emergency room on 11/08/2021 with chest pain radiating to the back, serum troponins and EKG were unremarkable and she was discharged home with recommendation to follow-up in the outpatient basis.  Found to have new onset LBBB, new onset severe LV systolic dysfunction with EF of 25 to 35%, cardiac catheterization 12/31/2021 revealing very mild calcific plaque in the proximal LAD otherwise normal coronary arteries with EF 35% with global hypokinesis.     She has been referred to [facility] cardiomyopathy clinic, seeing them on March 31, 2022.  No clinical evidence of heart failure.  She is now on maximum dose of Entresto which she is tolerating and labs are remained stable.  She is on moderate dose of carvedilol and heart rate is well controlled and also she still has occasional episodes of dizziness.  Hence we will continue present dose.  I have not started her on any Jardiance or spironolactone in the absence of active congestive heart failure symptoms or signs.  Hopefully her LVEF is improved by now, she is on maximum tolerated guideline directed medical therapy for now.  I would also get opinion from the [facility] cardiomyopathy clinic.  She probably will benefit from repeat echocardiogram and I may consider repeating her MRI, if EF has not improved.  I would like to see her back in 3 months for follow-up.  She has been scheduled for sleep study/sleep evaluation next week.     Adrian Prows, MD, [facility] 03/08/2022, 4:03 PM Office: [phone]

## 2022-03-13 ENCOUNTER — Ambulatory Visit (INDEPENDENT_AMBULATORY_CARE_PROVIDER_SITE_OTHER): Payer: BC Managed Care – PPO | Admitting: Neurology

## 2022-03-13 ENCOUNTER — Encounter: Payer: Self-pay | Admitting: Neurology

## 2022-03-13 VITALS — BP 113/64 | HR 58 | Ht 66.0 in | Wt 157.0 lb

## 2022-03-13 DIAGNOSIS — R0683 Snoring: Secondary | ICD-10-CM | POA: Diagnosis not present

## 2022-03-13 DIAGNOSIS — I447 Left bundle-branch block, unspecified: Secondary | ICD-10-CM

## 2022-03-13 DIAGNOSIS — I5022 Chronic systolic (congestive) heart failure: Secondary | ICD-10-CM | POA: Diagnosis not present

## 2022-03-13 DIAGNOSIS — I428 Other cardiomyopathies: Secondary | ICD-10-CM | POA: Diagnosis not present

## 2022-03-13 DIAGNOSIS — R351 Nocturia: Secondary | ICD-10-CM

## 2022-03-13 DIAGNOSIS — E663 Overweight: Secondary | ICD-10-CM | POA: Diagnosis not present

## 2022-03-13 NOTE — Patient Instructions (Signed)

## 2022-03-13 NOTE — Progress Notes (Signed)
Subjective:    Patient ID: Monica Adams is a 61 y.o. female.  HPI    Monica Foley, MD, PhD [facility] [address], [address] [address] [place], [place] [address]  Dear Monica Adams,   I saw your patient, Monica Adams, upon your kind request, in my Sleep clinic today for initial consultation of her sleep disorder, in particular, concern for underlying obstructive sleep apnea.  The patient is unaccompanied today.  As you know, Monica Adams is a 61 year old right-handed woman with an underlying medical history of chronic systolic congestive heart failure, left bundle branch block, hyperlipidemia, osteoporosis, nonischemic cardiomyopathy, and borderline overweight state, who reports snoring and sleep disruption.  I reviewed your office note from 01/25/2022.  Her Epworth sleepiness score is 6 out of 24, fatigue severity score is 29 out of 63.  She has nocturia about twice per average night, denies recurrent morning or nocturnal headaches.  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  She works in [employer].  Bedtime is generally between 9 and 9:30 PM and rise time anywhere from 5 AM to 6:30 AM.  She does not sleep well through the night, sometimes wakes up with a hot flash and has trouble going back to sleep.  She lives with her husband.  She is a non-smoker (smoked some in college) and drinks caffeine in the form of coffee, 1-1/2 cups in the morning and sweet tea, 1 large at or before lunch.  She does drink alcohol daily, typically 3 drinks per day.  They have 3 grown children, they have 1 dog in the household.  She does have a TV in her bedroom but they turn into a healthy 9:10 PM.  She is not aware of any family history of sleep apnea.  Her weight has been stable.  She had a tonsillectomy as a child.  Her Past Medical History Is Significant For: Past Medical History:  Diagnosis Date   Hyperlipidemia    LBBB (left bundle branch block)    Osteoporosis     Her  Past Surgical History Is Significant For: Past Surgical History:  Procedure Laterality Date   kidney stone removal     RIGHT/LEFT HEART CATH AND CORONARY ANGIOGRAPHY N/A 01/10/2022   Procedure: RIGHT/LEFT HEART CATH AND CORONARY ANGIOGRAPHY;  Surgeon: Yates Decamp, MD;  Location: [facility];  Service: Cardiovascular;  Laterality: N/A;    Her Family History Is Significant For: Family History  Problem Relation Age of Onset   Heart attack Mother 9   Heart disease Mother    Kidney disease Mother    Alzheimer's disease Father    Diabetes Brother    Heart attack Maternal Grandmother 50   Heart disease Maternal Grandfather 50   Heart disease Half-Brother 69       stents   Sleep apnea Neg Hx     Her Social History Is Significant For: Social History   Socioeconomic History   Marital status: Married    Spouse name: Not on file   Number of children: 3   Years of education: Not on file   Highest education level: Not on file  Occupational History   Not on file  Tobacco Use   Smoking status: Former    Packs/day: 0.25    Years: 4.00    Total pack years: 1.00    Types: Cigarettes    Quit date: 1985    Years since quitting: 38.7   Smokeless  tobacco: Never   Tobacco comments:    Smoked in 20's for about 4 years  Vaping Use   Vaping Use: Never used  Substance and Sexual Activity   Alcohol use: Yes    Alcohol/week: 42.0 standard drinks of alcohol    Types: 21 Glasses of wine, 21 Shots of liquor per week    Comment: daily 1-2 glasses   Drug use: Never   Sexual activity: Not on file  Other Topics Concern   Not on file  Social History Narrative   Not on file   Social Determinants of Health   Financial Resource Strain: Not on file  Food Insecurity: Not on file  Transportation Needs: Not on file  Physical Activity: Not on file  Stress: Not on file  Social Connections: Not on file    Her Allergies Are:  No Known Allergies:   Her Current Medications Are:   Outpatient Encounter Medications as of 03/13/2022  Medication Sig   carvedilol (COREG) 12.5 MG tablet Take 1 tablet (12.5 mg total) by mouth 2 (two) times daily.   ibandronate (BONIVA) 150 MG tablet Take 150 mg by mouth every 30 (thirty) days.   Multiple Vitamins-Minerals (ONE A DAY WOMEN 50 PLUS PO) Take 1 tablet by mouth daily.   nitroGLYCERIN (NITROSTAT) 0.4 MG SL tablet Place 1 tablet (0.4 mg total) under the tongue every 5 (five) minutes as needed for up to 4 doses for chest pain.   REPATHA SURECLICK 140 MG/ML SOAJ Inject 140 mg into the skin every 14 (fourteen) days.   rosuvastatin (CRESTOR) 20 MG tablet Take 20 mg by mouth daily.   sacubitril-valsartan (ENTRESTO) 97-103 MG Take 1 tablet by mouth 2 (two) times daily.   No facility-administered encounter medications on file as of 03/13/2022.  :   Review of Systems:  Out of a complete 14 point review of systems, all are reviewed and negative with the exception of these symptoms as listed below:    Review of Systems  Neurological:        Pt here for sleep consult  Pt snores,little fatigue  Pt denies sleep study,CPAP machine ,headaches ,hypertension     ESS :6 FSS:29    Objective:  Neurological Exam  Physical Exam Physical Examination:   Vitals:   03/13/22 1539  BP: 113/64  Pulse: (!) 58    General Examination: The patient is a very pleasant 61 y.o. female in no acute distress. She appears well-developed and well-nourished and well groomed.   HEENT: Normocephalic, atraumatic, pupils are equal, round and reactive to light, extraocular tracking is good without limitation to gaze excursion or nystagmus noted. Hearing is grossly intact. Face is symmetric with normal facial animation. Speech is clear with no dysarthria noted. There is no hypophonia. There is no lip, neck/head, jaw or voice tremor. Neck is supple with full range of passive and active motion. There are no carotid bruits on auscultation. Oropharynx exam reveals:  No significant mouth dryness, small airway entry, Mallampati class II, tonsils absent.  Neck circumference of 13 and half inches.  She has a minimal overbite.  Tongue protrudes centrally and palate elevates symmetrically.   Chest: Clear to auscultation without wheezing, rhonchi or crackles noted.  Heart: S1+S2+0, regular and normal without murmurs, rubs or gallops noted.   Abdomen: Soft, non-tender and non-distended.  Extremities: There is no pitting edema in the distal lower extremities bilaterally.   Skin: Warm and dry without trophic changes noted.   Musculoskeletal: exam reveals no obvious joint  deformities.    Neurologically:  Mental status: The patient is awake, alert and oriented in all 4 spheres. Her immediate and remote memory, attention, language skills and fund of knowledge are appropriate. There is no evidence of aphasia, agnosia, apraxia or anomia. Speech is clear with normal prosody and enunciation. Thought process is linear. Mood is normal and affect is normal.  Cranial nerves II - XII are as described above under HEENT exam.  Motor exam: Normal bulk, strength and tone is noted. There is no obvious tremor. Fine motor skills and coordination: grossly intact.  Cerebellar testing: No dysmetria or intention tremor. There is no truncal or gait ataxia.  Sensory exam: intact to light touch in the upper and lower extremities.  Gait, station and balance: She stands easily. No veering to one side is noted. No leaning to one side is noted. Posture is age-appropriate and stance is narrow based. Gait shows normal stride length and normal pace. No problems turning are noted.   Assessment and Plan:  In summary, Monica Adams is a very pleasant 61 y.o.-year old female with an underlying medical history of chronic systolic congestive heart failure, left bundle branch block, hyperlipidemia, osteoporosis, nonischemic cardiomyopathy, and borderline overweight state, whose history and physical  exam are concerning for sleep disordered breathing, supporting a current working diagnosis of unspecified sleep apnea, with the main differential diagnoses of obstructive sleep apnea (OSA) versus upper airway resistance syndrome (UARS) versus central sleep apnea (CSA), or mixed sleep apnea. A laboratory attended sleep study is considered gold standard for evaluation of sleep disordered breathing and is recommended at this time and clinically justified.   I had a long chat with the patient about my findings and the diagnosis of sleep apnea, particularly OSA, its prognosis and treatment options. We talked about medical/conservative treatments, surgical interventions and non-pharmacological approaches for symptom control. I explained, in particular, the risks and ramifications of untreated moderate to severe OSA, especially with respect to developing cardiovascular disease down the road, including congestive heart failure (CHF), difficult to treat hypertension, cardiac arrhythmias (particularly A-fib), neurovascular complications including TIA, stroke and dementia. Even type 2 diabetes has, in part, been linked to untreated OSA. Symptoms of untreated OSA may include (but may not be limited to) daytime sleepiness, nocturia (i.e. frequent nighttime urination), memory problems, mood irritability and suboptimally controlled or worsening mood disorder such as depression and/or anxiety, lack of energy, lack of motivation, physical discomfort, as well as recurrent headaches, especially morning or nocturnal headaches. We talked about the importance of maintaining a healthy lifestyle and striving for healthy weight. In addition, we talked about the importance of striving for and maintaining good sleep hygiene.  She is encouraged to scale back on her daily alcohol consumption, she is advised that alcohol is a known sleep disrupter. I recommended the following at this time: sleep study.  I outlined the differences between a  laboratory attended sleep study which is considered more comprehensive and accurate over the option of a home sleep test (HST); the latter may lead to underestimation of sleep disordered breathing in some instances and does not help with diagnosing upper airway resistance syndrome and is not accurate enough to diagnose primary central sleep apnea typically. I explained the different sleep test procedures to the patient in detail and also outlined possible surgical and non-surgical treatment options of OSA, including the use of a pressure airway pressure (PAP) device (ie CPAP, AutoPAP/APAP or BiPAP in certain circumstances), a custom-made dental device (aka oral appliance,  which would require a referral to a specialist dentist or orthodontist typically, and is generally speaking not considered a good choice for patients with full dentures or edentulous state), upper airway surgical options, such as traditional UPPP (which is not considered a first-line treatment) or the Inspire device (hypoglossal nerve stimulator, which would involve a referral for consultation with an ENT surgeon, after careful selection, following inclusion criteria). I explained the PAP treatment option to the patient in detail, as this is generally considered first-line treatment.  The patient indicated that she would be willing to try PAP therapy, if the need arises. I explained the importance of being compliant with PAP treatment, not only for insurance purposes but primarily to improve patient's symptoms symptoms, and for the patient's long term health benefit, including to reduce Her cardiovascular risks longer-term.    We will pick up our discussion about the next steps and treatment options after testing.  We will keep her posted as to the test results by phone call and/or MyChart messaging where possible.  We will plan to follow-up in sleep clinic accordingly as well.  I answered all her questions today and the patient was in  agreement.   I encouraged her to call with any interim questions, concerns, problems or updates or email [place] through [place].  Generally speaking, sleep test authorizations may take up to 2 weeks, sometimes less, sometimes longer, the patient is encouraged to get in touch with [place] if they do not hear back from the sleep lab staff directly within the next 2 weeks.  Thank you very much for allowing me to participate in the care of this nice patient. If I can be of any further assistance to you please do not hesitate to call me at [phone].  Sincerely,   Star Age, MD, PhD

## 2022-03-23 ENCOUNTER — Telehealth: Payer: Self-pay | Admitting: Neurology

## 2022-03-23 NOTE — Telephone Encounter (Signed)
HST- [employer] Josem Kaufmann: [id number] (exp. 03/21/22 to 05/19/22)   Patient is scheduled at [facility] for 04/18/22 at 3:30 pm.  Mailed packet to the patient.

## 2022-03-31 DIAGNOSIS — I5022 Chronic systolic (congestive) heart failure: Secondary | ICD-10-CM | POA: Diagnosis not present

## 2022-03-31 DIAGNOSIS — I110 Hypertensive heart disease with heart failure: Secondary | ICD-10-CM | POA: Diagnosis not present

## 2022-03-31 DIAGNOSIS — I10 Essential (primary) hypertension: Secondary | ICD-10-CM | POA: Diagnosis not present

## 2022-04-18 ENCOUNTER — Ambulatory Visit: Payer: BC Managed Care – PPO | Admitting: Neurology

## 2022-04-18 DIAGNOSIS — R0683 Snoring: Secondary | ICD-10-CM

## 2022-04-18 DIAGNOSIS — I447 Left bundle-branch block, unspecified: Secondary | ICD-10-CM

## 2022-04-18 DIAGNOSIS — R351 Nocturia: Secondary | ICD-10-CM

## 2022-04-18 DIAGNOSIS — G4733 Obstructive sleep apnea (adult) (pediatric): Secondary | ICD-10-CM

## 2022-04-18 DIAGNOSIS — I428 Other cardiomyopathies: Secondary | ICD-10-CM

## 2022-04-18 DIAGNOSIS — I5022 Chronic systolic (congestive) heart failure: Secondary | ICD-10-CM

## 2022-04-18 DIAGNOSIS — E663 Overweight: Secondary | ICD-10-CM

## 2022-04-20 NOTE — Progress Notes (Signed)
See procedure note.

## 2022-04-21 DIAGNOSIS — I5022 Chronic systolic (congestive) heart failure: Secondary | ICD-10-CM | POA: Diagnosis not present

## 2022-04-21 DIAGNOSIS — I10 Essential (primary) hypertension: Secondary | ICD-10-CM | POA: Diagnosis not present

## 2022-04-24 NOTE — Addendum Note (Signed)
Addended by: Star Age on: 04/24/2022 05:51 PM   Modules accepted: Orders

## 2022-04-24 NOTE — Procedures (Signed)
   [facility]  HOME SLEEP TEST (Watch PAT) REPORT  STUDY DATE: 04/18/2022  DOB: 06/20/1961  MRN: [id number]  ORDERING CLINICIAN: Star Age, MD, PhD   REFERRING CLINICIAN: Dr. Adrian Prows  CLINICAL INFORMATION/HISTORY:  61 year old female with a history of chronic systolic congestive heart failure, left bundle branch block, hyperlipidemia, osteoporosis, nonischemic cardiomyopathy, and borderline overweight state, who reports snoring and sleep disruption.    Epworth sleepiness score: 6/24.  BMI: 25.2 kg/m  FINDINGS:   Sleep Summary:   Total Recording Time (hours, min): 8 hours, 48 min  Total Sleep Time (hours, min):  7 hours, 51 min  Percent REM (%):    32%   Respiratory Indices:   Calculated pAHI (per hour):  26.2/hour         REM pAHI:    38.9/hour       NREM pAHI: 20.3/hour  Central pAHI: 2.3/hour  Oxygen Saturation Statistics:    Oxygen Saturation (%) Mean: 93%   Minimum oxygen saturation (%):                 81%   O2 Saturation Range (%): 81- 99%    O2 Saturation (minutes) <=88%: 3.7 min  Pulse Rate Statistics:   Pulse Mean (bpm):    63/min    Pulse Range (44- 91/min)   IMPRESSION: OSA (obstructive sleep apnea)   RECOMMENDATION:  This home sleep test demonstrates moderate obstructive sleep apnea with a total AHI of 26.2/hour and O2 nadir of 81%.  Intermittent mild to moderate snoring was detected. Treatment with a positive airway pressure (PAP) device is recommended. The patient will be advised to proceed with an autoPAP titration/trial at home for now. A full night titration study may be considered to optimize treatment settings, monitor proper oxygen saturations and aid with improvement of tolerance and adherence, if needed down the road. Alternative treatment options may include a dental device through dentistry or orthodontics in selected patients or Inspire (hypoglossal nerve stimulator) in carefully selected patients (meeting  inclusion criteria).  Concomitant weight loss is recommended (where clinically appropriate). Please note that untreated obstructive sleep apnea may carry additional perioperative morbidity. Patients with significant obstructive sleep apnea should receive perioperative PAP therapy and the surgeons and particularly the anesthesiologist should be informed of the diagnosis and the severity of the sleep disordered breathing. The patient should be cautioned not to drive, work at heights, or operate dangerous or heavy equipment when tired or sleepy. Review and reiteration of good sleep hygiene measures should be pursued with any patient. Other causes of the patient's symptoms, including circadian rhythm disturbances, an underlying mood disorder, medication effect and/or an underlying medical problem cannot be ruled out based on this test. Clinical correlation is recommended.  The patient and her referring provider will be notified of the test results. The patient will be seen in follow up in sleep clinic at [facility]  I certify that I have reviewed the raw data recording prior to the issuance of this report in accordance with the standards of the American Academy of Sleep Medicine (AASM).  INTERPRETING PHYSICIAN:   Star Age, MD, PhD  Board Certified in Neurology and Sleep Medicine  [facility] [address], [place], [place] [address] [phone]

## 2022-04-26 ENCOUNTER — Telehealth: Payer: Self-pay | Admitting: *Deleted

## 2022-04-26 ENCOUNTER — Encounter: Payer: Self-pay | Admitting: Cardiology

## 2022-04-26 DIAGNOSIS — M810 Age-related osteoporosis without current pathological fracture: Secondary | ICD-10-CM | POA: Diagnosis not present

## 2022-04-26 DIAGNOSIS — E785 Hyperlipidemia, unspecified: Secondary | ICD-10-CM | POA: Diagnosis not present

## 2022-04-26 NOTE — Telephone Encounter (Signed)
-----   Message from Star Age, MD sent at 04/24/2022  5:50 PM EDT ----- Patient referred by Dr. Einar Gip, seen by me on 03/13/2022, patient had a HST on 04/18/2022.    Please call and notify the patient that the recent home sleep test showed obstructive sleep apnea in the moderate range. I recommend treatment in the form of autoPAP, which means, that we don't have to bring her in for a sleep study with CPAP, but will let her start using a so called autoPAP machine at home, which is a CPAP-like machine with self-adjusting pressures. We will send the order to a local DME company (of her choice, or as per insurance requirement). The DME representative will fit her with a mask, educate her on how to use the machine, how to put the mask on, etc. I have placed an order in the chart. Please send the order, talk to patient, send report to referring MD. We will need a FU in sleep clinic for 10 weeks post-PAP set up, please arrange that with me or one of our NPs. Also reinforce the need for compliance with treatment. Thanks,   Star Age, MD, PhD [facility])

## 2022-04-26 NOTE — Telephone Encounter (Signed)
I called pt and relayed the results of sleep study.  (Moderate) and recommend autopap.  She was not ready to jump into gettting autopap right now (looking at REM stats).  I relayed that AHI like to be below 5, hers was 26.2 /hr  I offered appt for her to discuss results, she was appreciative.   Made appt 05-29-2022 at 1345.  Offered waitlist for later afternoon appt, she said no this was ok.

## 2022-05-03 DIAGNOSIS — Z0000 Encounter for general adult medical examination without abnormal findings: Secondary | ICD-10-CM | POA: Diagnosis not present

## 2022-05-03 DIAGNOSIS — I5022 Chronic systolic (congestive) heart failure: Secondary | ICD-10-CM | POA: Diagnosis not present

## 2022-05-03 DIAGNOSIS — R82998 Other abnormal findings in urine: Secondary | ICD-10-CM | POA: Diagnosis not present

## 2022-05-03 DIAGNOSIS — Z23 Encounter for immunization: Secondary | ICD-10-CM | POA: Diagnosis not present

## 2022-05-03 DIAGNOSIS — Z1331 Encounter for screening for depression: Secondary | ICD-10-CM | POA: Diagnosis not present

## 2022-05-03 NOTE — Telephone Encounter (Signed)
Pt id wanting to move forward with the AutoPAP, she would like a call to discuss setting that up instead of having the appointment set for 05/29/22. Please call pt to discuss,

## 2022-05-04 ENCOUNTER — Encounter: Payer: Self-pay | Admitting: *Deleted

## 2022-05-04 NOTE — Telephone Encounter (Signed)
I called pt and relayed the results of her sleep study moderate osa. Dr. Frances Furbish recommends that pt need autoapap. I reviewed PAP compliance expectations with the pt. Pt is agreeable to starting an auto-PAP. I advised pt that an order will be sent to a DME, [facility], and they will call the pt within about one week after they file with the pt's insurance. they will show the pt how to use the machine, fit for masks, and troubleshoot the auto-PAP if needed. A follow up appt was made for insurance purposes with Dr. Frances Furbish on 07-18-2021 at 0745. Pt verbalized understanding to arrive 15 minutes early and bring their auto-PAP. A letter with all of this information in it will be mailed to the pt as a reminder. I verified with the pt that the address we have on file is correct. Pt verbalized understanding of results. Pt had no questions at this time but was encouraged to call back if questions arise. I have sent the order to [facility] and have received confirmation that they have received the order.

## 2022-05-10 DIAGNOSIS — G4733 Obstructive sleep apnea (adult) (pediatric): Secondary | ICD-10-CM | POA: Diagnosis not present

## 2022-05-29 ENCOUNTER — Ambulatory Visit: Payer: BC Managed Care – PPO | Admitting: Neurology

## 2022-06-08 ENCOUNTER — Encounter: Payer: Self-pay | Admitting: Cardiology

## 2022-06-08 ENCOUNTER — Ambulatory Visit: Payer: BC Managed Care – PPO | Admitting: Cardiology

## 2022-06-08 VITALS — BP 131/78 | HR 66 | Resp 16 | Ht 66.0 in | Wt 155.0 lb

## 2022-06-08 DIAGNOSIS — I5022 Chronic systolic (congestive) heart failure: Secondary | ICD-10-CM

## 2022-06-08 DIAGNOSIS — G4733 Obstructive sleep apnea (adult) (pediatric): Secondary | ICD-10-CM

## 2022-06-08 DIAGNOSIS — I447 Left bundle-branch block, unspecified: Secondary | ICD-10-CM

## 2022-06-08 NOTE — Progress Notes (Signed)
Primary Physician/Referring:  Crist Infante, MD  Patient ID: Monica Adams, female    DOB: [birth year], 61 y.o.   MRN: [id number]  No chief complaint on file.   HPI:    Monica Adams  is a 61 y.o. Caucasian female patient with multiple members in the family having had MI in their early 40 years of age (Mother with MI in early 30s. Brother at age 74 had 2 coronary stents. Both maternal grandparents with CAD in 63s), hyperlipidemia, elevated LPA,  coronary calcium score done on 12/26/2017 in the 45 percentile, new onset LBBB in May 2023, new onset severe LV systolic dysfunction with EF of 25 to 35%, cardiac catheterization 12/31/2021 revealing very mild calcific plaque in the proximal LAD otherwise normal coronary arteries with EF 35% with global hypokinesis, moderate obstructive sleep apnea diagnosed on 04/18/2022, now on BiPAP, presently on guideline directed medical therapy and also evaluated by [facility] cardiology who recommended no significant change except reducing the dose of Entresto in view of occasional dizziness and low blood pressure.  She now presents for a 70-monthoffice visit.  Remains asymptomatic.  No chest pain. Overall states that she is doing well and fatigue is improved, she thinks that she is back to her baseline.  No PND or orthopnea, no dyspnea, no leg edema.  Past Medical History:  Diagnosis Date   Hyperlipidemia    LBBB (left bundle branch block)    Osteoporosis    Past Surgical History:  Procedure Laterality Date   kidney stone removal     RIGHT/LEFT HEART CATH AND CORONARY ANGIOGRAPHY N/A 01/10/2022   Procedure: RIGHT/LEFT HEART CATH AND CORONARY ANGIOGRAPHY;  Surgeon: GAdrian Prows MD;  Location: [facility];  Service: Cardiovascular;  Laterality: N/A;   Family History  Problem Relation Age of Onset   Heart attack Mother 560  Heart disease Mother    Kidney disease Mother    Alzheimer's disease Father    Diabetes Brother    Heart attack Maternal  Grandmother 567  Heart disease Maternal Grandfather 567  Heart disease Half-Brother 56       stents   Sleep apnea Neg Hx     Social History   Tobacco Use   Smoking status: Former    Packs/day: 0.25    Years: 4.00    Total pack years: 1.00    Types: Cigarettes    Quit date: 1985    Years since quitting: 38.9   Smokeless tobacco: Never   Tobacco comments:    Smoked in 20's for about 4 years  Substance Use Topics   Alcohol use: Yes    Alcohol/week: 42.0 standard drinks of alcohol    Types: 21 Glasses of wine, 21 Shots of liquor per week    Comment: daily 1-2 glasses   Marital Status: Married  ROS  Review of Systems  Constitutional: Negative for malaise/fatigue.  Cardiovascular:  Negative for chest pain, dyspnea on exertion and leg swelling.  Respiratory:  Positive for snoring (On AutoPAP).    Objective  Blood pressure 131/78, pulse 66, resp. rate 16, height _0  (1.676 m), weight 155 lb (70.3 kg), SpO2 99 %. Body mass index is 25.02 kg/m.     06/08/2022    3:18 PM 03/13/2022    3:39 PM 03/08/2022    3:30 PM  Vitals with BMI  Height _1  _2  _3   Weight 155 lbs 157 lbs 155 lbs  BMI 25.03 25.35 25.03  Systolic 583 094 076  Diastolic 78 64 74  Pulse 66 58 60    Physical Exam Neck:     Vascular: No JVD.  Cardiovascular:     Rate and Rhythm: Normal rate and regular rhythm.     Pulses: Intact distal pulses.     Heart sounds: Murmur heard.     Early systolic murmur is present with a grade of 2/6 at the upper right sternal border.     No gallop.  Pulmonary:     Effort: Pulmonary effort is normal.     Breath sounds: Normal breath sounds.  Abdominal:     General: Bowel sounds are normal.     Palpations: Abdomen is soft.  Musculoskeletal:     Right lower leg: No edema.     Left lower leg: No edema.     Medications and allergies  No Known Allergies   Medication list after today's encounter   Current Outpatient Medications:    carvedilol (COREG) 12.5 MG  tablet, Take 1 tablet (12.5 mg total) by mouth 2 (two) times daily., Disp: 180 tablet, Rfl: 2   ibandronate (BONIVA) 150 MG tablet, Take 150 mg by mouth every 30 (thirty) days., Disp: , Rfl:    Multiple Vitamins-Minerals (ONE A DAY WOMEN 50 PLUS PO), Take 1 tablet by mouth daily., Disp: , Rfl:    nitroGLYCERIN (NITROSTAT) 0.4 MG SL tablet, Place 1 tablet (0.4 mg total) under the tongue every 5 (five) minutes as needed for up to 4 doses for chest pain., Disp: 4 tablet, Rfl: 0   REPATHA SURECLICK 808 MG/ML SOAJ, Inject 140 mg into the skin every 14 (fourteen) days., Disp: , Rfl:    rosuvastatin (CRESTOR) 20 MG tablet, Take 20 mg by mouth daily., Disp: , Rfl:    sacubitril-valsartan (ENTRESTO) 97-103 MG, Take 1 tablet by mouth 2 (two) times daily. (Patient taking differently: Take 0.5 tablets by mouth 2 (two) times daily.), Disp: 180 tablet, Rfl: 3  Laboratory examination:   Recent Labs    11/08/21 0927 01/06/22 1540 01/10/22 1656 01/10/22 1657 01/10/22 1702 02/21/22 1509  NA 140 140   < > 142 141 138  K 4.3 4.6   < > 4.1 4.0 4.3  CL 111 107*  --   --   --  102  CO2 24 19*  --   --   --  25  GLUCOSE 102* 102*  --   --   --  139*  BUN 19 24  --   --   --  19  CREATININE 0.71 0.76  --   --   --  0.70  CALCIUM 9.2 9.5  --   --   --  10.3*  GFRNONAA >60  --   --   --   --   --    < > = values in this interval not displayed.   CrCl cannot be calculated (Patient's most recent lab result is older than the maximum 21 days allowed.).     Latest Ref Rng & Units 02/21/2022    3:09 PM 01/10/2022    5:02 PM 01/10/2022    4:57 PM  CMP  Glucose 70 - 99 mg/dL 139     BUN 6 - 24 mg/dL 19     Creatinine 0.57 - 1.00 mg/dL 0.70     Sodium 134 - 144 mmol/L 138  141  142   Potassium 3.5 - 5.2 mmol/L 4.3  4.0  4.1   Chloride 96 -  106 mmol/L 102     CO2 20 - 29 mmol/L 25     Calcium 8.7 - 10.2 mg/dL 10.3         Latest Ref Rng & Units 01/10/2022    5:02 PM 01/10/2022    4:57 PM 01/10/2022    4:56 PM   CBC  Hemoglobin 12.0 - 15.0 g/dL 12.9  13.3  13.3   Hematocrit 36.0 - 46.0 % 38.0  39.0  39.0    BNP (last 3 results) Recent Labs    01/06/22 1540  BNP 43.2    External labs:   Labs done 05/15/2021:  BUN 9, creatinine 0.8, EGFR 99.6, potassium 4.8, LFTs normal.  Hb 13.1/HCT 39.0, platelets 256, normal indicis.  Total cholesterol 137, triglycerides 34, HDL 85, LDL 45.  TSH normal at 1.00, vitamin D 36.7.  Radiology:    Cardiac Studies:   Coronary calcium score 12/26/2017: Calcium score: 74.  LM: 0.  LAD: 74.  Cx: 0.  RCA: 0   Treadmill exercise stress test 03/04/2018 Indication: screening for CAD The patient exercised on Bruce protocol for  12:11 min. Patient achieved  13.60 METS and reached HR  182 bpm, which is  109 % of maximum age-predicted HR.  Stress test terminated due to fatigue. Resting EKG demonstrates Normal sinus rhythm. ST Changes: With peak exercise there was no ST-T changes of ischemia. Arrhythmias: none. Chest Pain: none. BP Response to Exercise: Normal resting BP- appropriate response. HR Response to Exercise: Appropriate.  Exercise capacity was above average for age. Continue primary/secondary prevention.  PCV MYOCARDIAL PERFUSION WITH LEXISCAN 12/07/2021  Narrative Lexiscan Nuclear stress test 12/07/2021: Nondiagnostic ECG stress. ECG demonstrated normal sinus rhythm with left bundle branch block. The heart rate and BP response was consistent with Regadenoson. Myocardial perfusion is abnormal. There is a fixed mild defect in the anterior and anteroseptal and apical region. Overall LV systolic function is moderately abnormal without regional wall motion abnormalities and there is global hypokinesis. Stress LV EF: 34%. Findings may be secondary to LBBB. Correlate with echocardiogram. No previous exam available for comparison. High risk study.   PCV ECHOCARDIOGRAM COMPLETE 11/23/2021  Narrative Echocardiogram 11/23/2021: Left ventricle cavity is  normal in size. Mild concentric hypertrophy of the left ventricle. Abnormal septal wall motion due to left bundle branch block. Severe global hypokinesis. LVEF 25-30%. Apec not well visualized. Doppler evidence of grade I (impaired) diastolic dysfunction, normal LAP. Mild (Grade I) mitral regurgitation. Normal right atrial pressure.    Right & Left Heart Catheterization 01/10/22: RA 3/2/, mean 0 mmHg. RV 16/-3, EDP 12 mmHg. PA 17/2, mean 8 mmHg.  PA saturation 72%. PW 7/6, mean 5 mmHg. LV: 25/2, EDP 19 mmHg.  Ao 129/63, mean 86 mmHg.  Aortic saturation 93%. CO 5.92, CI 3.3 by Fick.  QP/QS 1.00.  LV: Global hypokinesis.  EF 35% approximately. LM: Short.  Bifurcates into LAD and CX. LAD: Very large caliber vessel, smooth and normal.  Gives origin to 3 large diagonals.  Proximal to mid segment of the LAD has a calcific shelf on the inferior margin without any luminal obstruction.  LAD itself is very smooth and normal otherwise. CX: Dominant vessel.  Gives origin to moderate-sized OM1 and 2 and continues in the AV groove and gives origin to PDA branches.  Smooth and normal. RCA: Small and nondominant.  Sleep study 04/18/2022: Moderate obstructive sleep apnea with a total AHI of 26.2/hour and O2 nadir of 81% now treated with BiPAP.  EKG:  EKG 06/08/2022: Normal sinus rhythm at rate of 54 bpm, left bundle branch block.  No further analysis.  Compared to 11/16/2021, no significant change.  Assessment     ICD-10-CM   1. Chronic systolic heart failure (HCC)  I50.22 EKG 12-Lead    PCV ECHOCARDIOGRAM COMPLETE    2. LBBB (left bundle branch block)  I44.7 PCV ECHOCARDIOGRAM COMPLETE    3. OSA on CPAP  G47.33        There are no discontinued medications.   No orders of the defined types were placed in this encounter.  Orders Placed This Encounter  Procedures   EKG 12-Lead   PCV ECHOCARDIOGRAM COMPLETE    Standing Status:   Future    Standing Expiration Date:   06/09/2023     Recommendations:   Monica Adams is a 61 y.o.  Caucasian female patient with multiple members in the family having had MI in their early 56 years of age (Mother with MI in early 53s. Brother at age 27 had 2 coronary stents. Both maternal grandparents with CAD in 65s), hyperlipidemia, elevated LPA,  coronary calcium score done on 12/26/2017 in the 29 percentile, new onset LBBB in May 2023, new onset severe LV systolic dysfunction with EF of 25 to 35%, cardiac catheterization 12/31/2021 revealing very mild calcific plaque in the proximal LAD otherwise normal coronary arteries with EF 35% with global hypokinesis, moderate obstructive sleep apnea diagnosed on 04/18/2022, now on BiPAP, presently on guideline directed medical therapy and also evaluated by [facility] cardiology who recommended no significant change except reducing the dose of Entresto in view of occasional dizziness and low blood pressure.  She now presents for a 77-monthoffice visit.  Remains asymptomatic.  1. Chronic systolic heart failure ([facility] Patient is presently doing well and on appropriate and guideline directed medical therapy.  Will repeat echocardiogram to reevaluate LV systolic function.  She will need repeat echocardiogram  2. LBBB (left bundle branch block) This remains unchanged.  No clinical indication for resynchronization therapy at this point.  3. OSA on CPAP Compliant with BiPAP, continue the same, advised her and encouraged her to continue using it on a regular basis.  Office visit in 6 months.  External labs, external communications and sleep study reviewed.    JAdrian Prows MD, [facility]12/14/2023, 4:05 PM Office: [phone]

## 2022-06-09 DIAGNOSIS — G4733 Obstructive sleep apnea (adult) (pediatric): Secondary | ICD-10-CM | POA: Diagnosis not present

## 2022-06-22 ENCOUNTER — Ambulatory Visit: Payer: BC Managed Care – PPO

## 2022-06-22 DIAGNOSIS — I5022 Chronic systolic (congestive) heart failure: Secondary | ICD-10-CM

## 2022-06-22 DIAGNOSIS — I447 Left bundle-branch block, unspecified: Secondary | ICD-10-CM | POA: Diagnosis not present

## 2022-06-30 DIAGNOSIS — H0011 Chalazion right upper eyelid: Secondary | ICD-10-CM | POA: Diagnosis not present

## 2022-07-10 DIAGNOSIS — G4733 Obstructive sleep apnea (adult) (pediatric): Secondary | ICD-10-CM | POA: Diagnosis not present

## 2022-07-18 ENCOUNTER — Encounter: Payer: Self-pay | Admitting: Neurology

## 2022-07-18 ENCOUNTER — Ambulatory Visit (INDEPENDENT_AMBULATORY_CARE_PROVIDER_SITE_OTHER): Payer: BC Managed Care – PPO | Admitting: Neurology

## 2022-07-18 VITALS — BP 97/58 | HR 57 | Ht 66.0 in | Wt 159.8 lb

## 2022-07-18 DIAGNOSIS — G4733 Obstructive sleep apnea (adult) (pediatric): Secondary | ICD-10-CM

## 2022-07-18 NOTE — Progress Notes (Signed)
Subjective:    Adams ID: Monica Adams is a 62 y.o. female.  HPI    Interim history:   Monica Adams is a 62 year old right-handed woman with an underlying medical history of chronic systolic congestive heart failure, left bundle branch block, hyperlipidemia, osteoporosis, nonischemic cardiomyopathy, and borderline overweight state, who presents for follow-up consultation of her obstructive sleep apnea after interim testing and starting AutoPap therapy.  Monica Adams is unaccompanied today.  I first met her at Monica request of her cardiologist on 03/13/2022, at which time she reported snoring and sleep disruption as well as nocturia.  She was advised to proceed with a sleep study. Her home sleep test from 04/18/22 showed moderate obstructive sleep apnea with a total AHI of 26.2/hour and O2 nadir of 81%.  Intermittent mild to moderate snoring was detected.  She was advised to proceed with home AutoPap therapy.  Her set up date was 05/10/2022.  She has a ResMed air sense [place], DME company is [facility].   Today, 07/18/2022: I reviewed her AutoPap compliance data from 06/17/2022 through 07/16/2022, which is a total of 30 days, during which time she used her machine every night with percent use days greater than 4 hours at 77%, indicating adequate compliance with an average usage of 5 hours and 23 minutes, residual AHI at goal at 4.2/h, 95th percentile of pressure at 9 cm with a range of 5 to 12 cm with EPR of 3.  Leak acceptable but on Monica higher side with Monica 95th percentile at 23.8 L/min.  She reports doing fairly well, still adjusting to treatment.  She has some sleep disruption from restless sleep but also struggling at times to keep her mouth closed, wakes up sometimes with really dry mouth, had to switch from a fullface mask to a nasal mask because of discomfort with a fullface mask.  She also has a puppy that requires attention at times at night.  She is very motivated to continue with  treatment.  She had some medication adjustment for her heart medicine.  She had a recent checkup with her cardiologist last month.  I reviewed Monica office visit note from 06/08/2022.  Monica Adams's allergies, current medications, family history, past medical history, past social history, past surgical history and problem list were reviewed and updated as appropriate.   Previously:   03/13/22: (She) reports snoring and sleep disruption.  I reviewed your office note from 01/25/2022.  Her Epworth sleepiness score is 6 out of 24, fatigue severity score is 29 out of 63.  She has nocturia about twice per average night, denies recurrent morning or nocturnal headaches.  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  She works in [employer].  Bedtime is generally between 9 and 9:30 PM and rise time anywhere from 5 AM to 6:30 AM.  She does not sleep well through Monica night, sometimes wakes up with a hot flash and has trouble going back to sleep.  She lives with her husband.  She is a non-smoker (smoked some in college) and drinks caffeine in Monica form of coffee, 1-1/2 cups in Monica morning and sweet tea, 1 large at or before lunch.  She does drink alcohol daily, typically 3 drinks per day.  They have 3 grown children, they have 1 dog in Monica household.  She does have a TV in her bedroom but they turn into a healthy 9:10 PM.  She is not aware of any family  history of sleep apnea.  Her weight has been stable.  She had a tonsillectomy as a child.    Her Past Medical History Is Significant For: Past Medical History:  Diagnosis Date   Hyperlipidemia    LBBB (left bundle branch block)    Osteoporosis     Her Past Surgical History Is Significant For: Past Surgical History:  Procedure Laterality Date   kidney stone removal     RIGHT/LEFT HEART CATH AND CORONARY ANGIOGRAPHY N/A 01/10/2022   Procedure: RIGHT/LEFT HEART CATH AND CORONARY ANGIOGRAPHY;  Surgeon: Yates Decamp, MD;  Location: [facility];   Service: Cardiovascular;  Laterality: N/A;    Her Family History Is Significant For: Family History  Problem Relation Age of Onset   Heart attack Mother 64   Heart disease Mother    Kidney disease Mother    Alzheimer's disease Father    Diabetes Brother    Heart attack Maternal Grandmother 50   Heart disease Maternal Grandfather 50   Heart disease Half-Brother 77       stents   Sleep apnea Neg Hx     Her Social History Is Significant For: Social History   Socioeconomic History   Marital status: Married    Spouse name: Not on file   Number of children: 3   Years of education: Not on file   Highest education level: Not on file  Occupational History   Not on file  Tobacco Use   Smoking status: Former    Packs/day: 0.25    Years: 4.00    Total pack years: 1.00    Types: Cigarettes    Quit date: 1985    Years since quitting: 39.0   Smokeless tobacco: Never   Tobacco comments:    Smoked in 20's for about 4 years  Vaping Use   Vaping Use: Never used  Substance and Sexual Activity   Alcohol use: Yes    Alcohol/week: 16.0 standard drinks of alcohol    Types: 8 Glasses of wine, 8 Shots of liquor per week    Comment: daily 1-2 glasses   Drug use: Never   Sexual activity: Not on file  Other Topics Concern   Not on file  Social History Narrative   Not on file   Social Determinants of Health   Financial Resource Strain: Not on file  Food Insecurity: Not on file  Transportation Needs: Not on file  Physical Activity: Not on file  Stress: Not on file  Social Connections: Not on file    Her Allergies Are:  No Known Allergies:   Her Current Medications Are:  Outpatient Encounter Medications as of 07/18/2022  Medication Sig   carvedilol (COREG) 12.5 MG tablet Take 1 tablet (12.5 mg total) by mouth 2 (two) times daily.   ibandronate (BONIVA) 150 MG tablet Take 150 mg by mouth every 30 (thirty) days.   Multiple Vitamins-Minerals (ONE A DAY WOMEN 50 PLUS PO) Take 1  tablet by mouth daily.   nitroGLYCERIN (NITROSTAT) 0.4 MG SL tablet Place 1 tablet (0.4 mg total) under Monica tongue every 5 (five) minutes as needed for up to 4 doses for chest pain.   REPATHA SURECLICK 140 MG/ML SOAJ Inject 140 mg into Monica skin every 14 (fourteen) days.   rosuvastatin (CRESTOR) 20 MG tablet Take 20 mg by mouth daily.   sacubitril-valsartan (ENTRESTO) 97-103 MG Take 1 tablet by mouth 2 (two) times daily. (Adams taking differently: Take 0.5 tablets by mouth 2 (two) times daily.)  No facility-administered encounter medications on file as of 07/18/2022.  :  Review of Systems:  Out of a complete 14 point review of systems, all are reviewed and negative with Monica exception of these symptoms as listed below:  Review of Systems  Neurological:        Pt here for CPAP f/u  Pt states she thinks she moves a lot at night Pt states at times air leaks . Pt states need new CPAP supplies     ESS :4 FSS:20     Objective:  Neurological Exam  Physical Exam Physical Examination:   Vitals:   07/18/22 0808  BP: (!) 97/58  Pulse: (!) 57    General Examination: Monica Adams is a very pleasant 62 y.o. female in no acute distress. She appears well-developed and well-nourished and well groomed.   HEENT: Normocephalic, atraumatic, pupils are equal, round and reactive to light, tracking well-preserved, face is symmetric with normal facial animation, speech is clear without dysarthria, hypophonia or voice tremor.  Neck with full range of motion, no carotid bruits.  Airway examination reveals mild mouth dryness, stable findings.  Tongue protrudes centrally and palate elevates symmetrically.     Chest: Clear to auscultation without wheezing, rhonchi or crackles noted.   Heart: S1+S2+0, regular and normal without murmurs, rubs or gallops noted.    Abdomen: Soft, non-tender and non-distended.   Extremities: There is no pitting edema in Monica distal lower extremities bilaterally.    Skin:  Warm and dry without trophic changes noted.    Musculoskeletal: exam reveals no obvious joint deformities.     Neurologically:  Mental status: Monica Adams is awake, alert and oriented in all 4 spheres. Her immediate and remote memory, attention, language skills and fund of knowledge are appropriate. There is no evidence of aphasia, agnosia, apraxia or anomia. Speech is clear with normal prosody and enunciation. Thought process is linear. Mood is normal and affect is normal.  Cranial nerves II - XII are as described above under HEENT exam.  Motor exam: Normal bulk, strength and tone is noted. There is no obvious tremor. Fine motor skills and coordination: grossly intact.  Cerebellar testing: No dysmetria or intention tremor. There is no truncal or gait ataxia.  Sensory exam: intact to light touch in Monica upper and lower extremities.  Gait, station and balance: She stands easily. No veering to one side is noted. No leaning to one side is noted. Posture is age-appropriate and stance is narrow based. Gait shows normal stride length and normal pace. No problems turning are noted.    Assessment and Plan:  In summary, Monica Adams is a very pleasant 62 year old female with an underlying medical history of chronic systolic congestive heart failure, left bundle branch block, hyperlipidemia, osteoporosis, nonischemic cardiomyopathy, and borderline overweight state, who presents for follow-up consultation of her obstructive sleep apnea after interim testing and starting AutoPap therapy.  Her home sleep test from 04/18/22 showed moderate obstructive sleep apnea with a total AHI of 26.2/hour and O2 nadir of 81%.  Intermittent mild to moderate snoring was detected.  She started home autoPap therapy on 05/10/2022.  She has a [employer]Med air sense [place], DME company is [facility].  She is compliant with treatment.  She is switched from a fullface mask to a nasal mask, she is still adjusting to treatment.  She  is motivated to continue with her AutoPap therapy, numbers look good, leak on Monica higher side from time to time.  She  is advised to continue with her current treatment settings and commended for her treatment adherence.  At this juncture, she is advised to follow-up routinely in sleep clinic to see one of our nurse practitioners in 1 year, we can offer her a virtual visit through MyChart as well.  I answered all her questions today and she was in agreement.   I spent 30 minutes in total face-to-face time and in reviewing records during pre-charting, more than 50% of which was spent in counseling and coordination of care, reviewing test results, reviewing medications and treatment regimen and/or in discussing or reviewing Monica diagnosis of OSA, Monica prognosis and treatment options. Pertinent laboratory and imaging test results that were available during this visit with Monica Adams were reviewed by me and considered in my medical decision making (see chart for details).

## 2022-07-18 NOTE — Patient Instructions (Signed)
It was nice to see you again today. I am glad to hear, things are going well with your autoPAP therapy. You have adjusted well to treatment with your new machine, and you are compliant with it. You have also fulfilled the insurance-mandated compliance percentage, which is reassuring, so you can get ongoing supplies through your insurance. Please talk to your DME provider about getting replacement supplies on a regular basis. Please be sure to change your filter every month, your mask about every 3 months, hose about every 6 months, humidifier chamber about yearly. Some restrictions are imposed by your insurance carrier with regard to how frequently you can get certain supplies.  Your DME company can provide further details if necessary.   Please continue using your autoPAP regularly. While your insurance requires that you use PAP at least 4 hours each night on 70% of the nights, I recommend, that you not skip any nights and use it throughout the night if you can. Getting used to PAP and staying with the treatment long term does take time and patience and discipline. Untreated obstructive sleep apnea when it is moderate to severe can have an adverse impact on cardiovascular health and raise her risk for heart disease, arrhythmias, hypertension, congestive heart failure, stroke and diabetes. Untreated obstructive sleep apnea causes sleep disruption, nonrestorative sleep, and sleep deprivation. This can have an impact on your day to day functioning and cause daytime sleepiness and impairment of cognitive function, memory loss, mood disturbance, and problems focussing. Using PAP regularly can improve these symptoms.  We can see you in 1 year, you can see one of our nurse practitioners as you are stable.   

## 2022-07-27 ENCOUNTER — Encounter: Payer: Self-pay | Admitting: Family Medicine

## 2022-08-10 DIAGNOSIS — G4733 Obstructive sleep apnea (adult) (pediatric): Secondary | ICD-10-CM | POA: Diagnosis not present

## 2022-08-17 DIAGNOSIS — G4733 Obstructive sleep apnea (adult) (pediatric): Secondary | ICD-10-CM | POA: Diagnosis not present

## 2022-08-23 ENCOUNTER — Other Ambulatory Visit: Payer: Self-pay | Admitting: Cardiology

## 2022-08-23 DIAGNOSIS — I5022 Chronic systolic (congestive) heart failure: Secondary | ICD-10-CM

## 2022-09-23 IMAGING — DX DG CHEST 1V PORT
1 series · 1 of 1 positions shown · non-contrast
Comparison: None Available.

CLINICAL DATA: 60-year-old female with mid chest pain.

EXAM:
PORTABLE CHEST 1 VIEW

[chest ap]
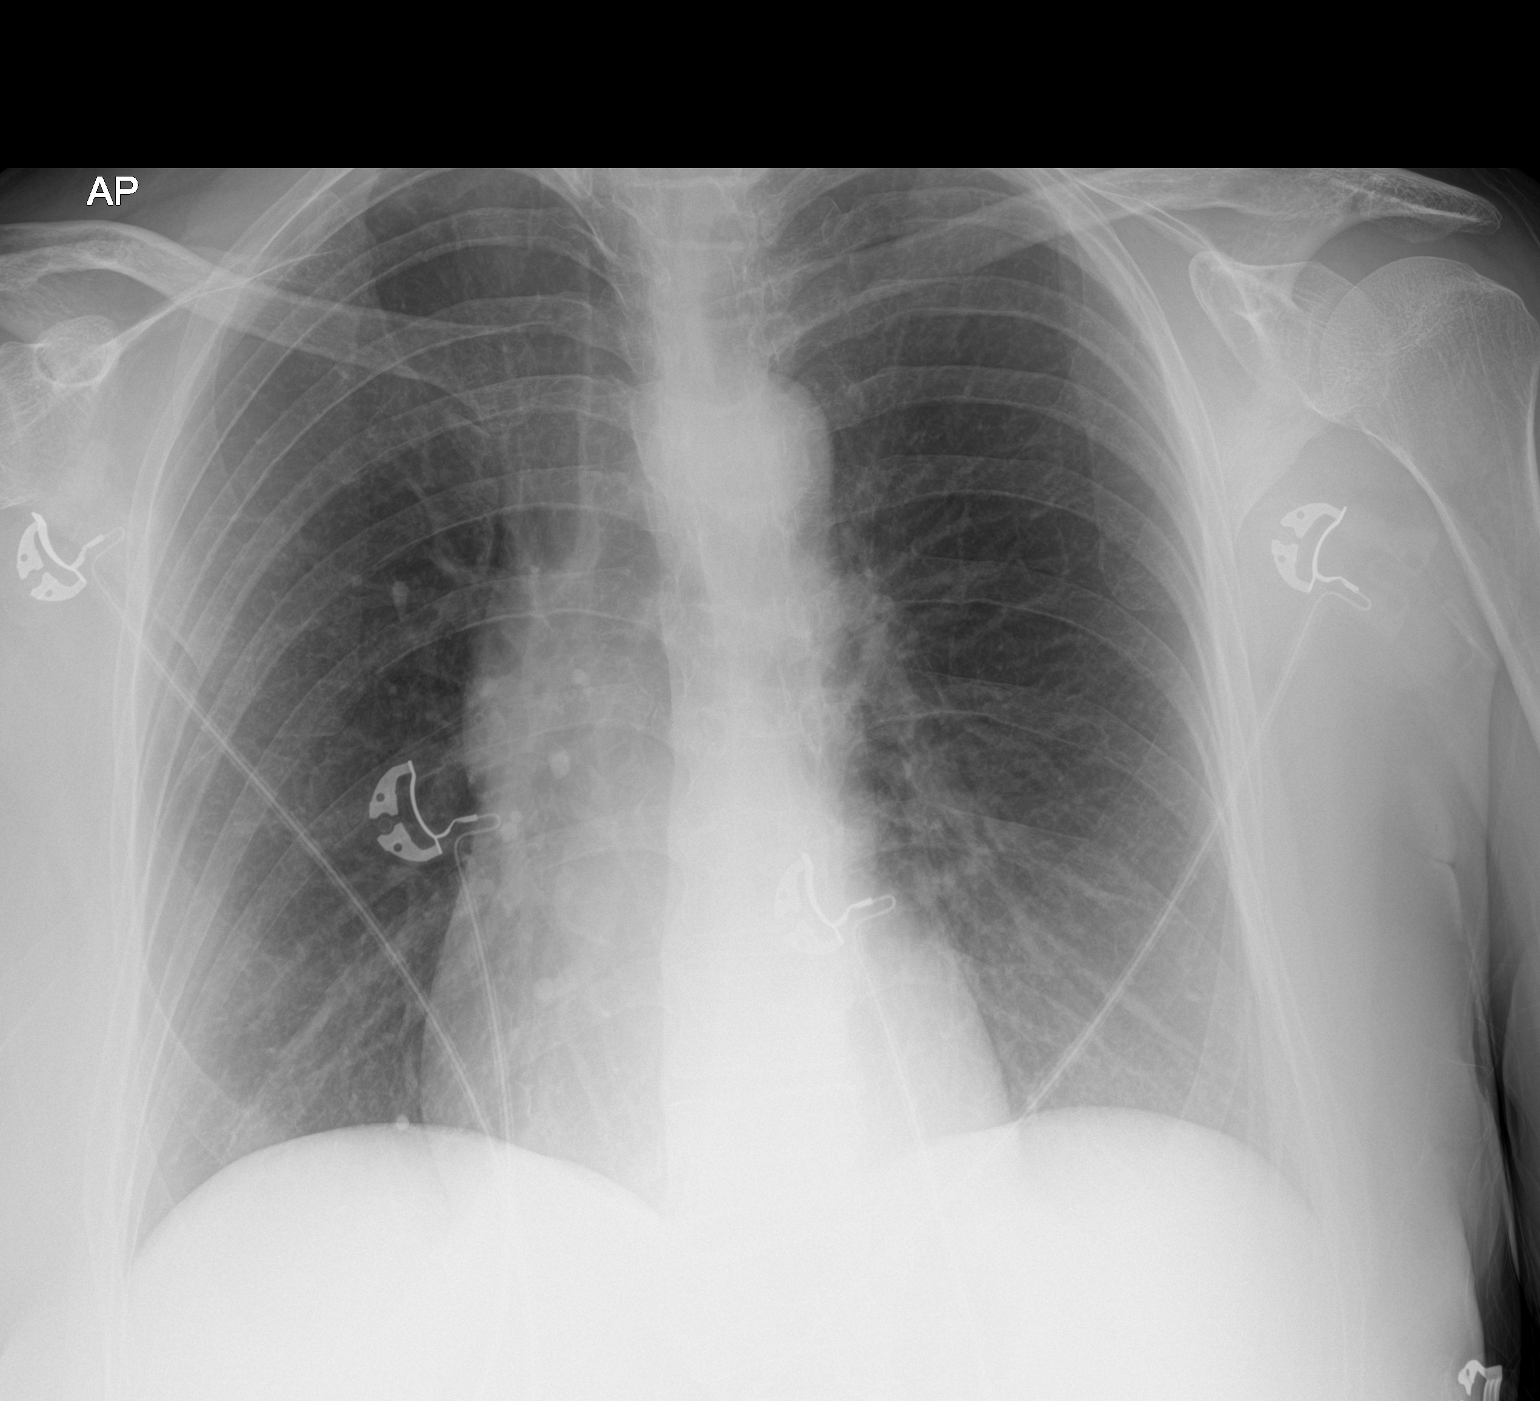

[1 of 1 positions shown; findings below may reference images not displayed]

FINDINGS: Portable AP semi upright view at 4478 hours. Mildly rotated to the
right. Lung volumes and mediastinal contours are within normal
limits. Visualized tracheal air column is within normal limits.
Allowing for portable technique the lungs are clear. No pneumothorax
or pleural effusion. No osseous abnormality identified.
IMPRESSION: Negative portable chest.

## 2022-09-25 DIAGNOSIS — D2262 Melanocytic nevi of left upper limb, including shoulder: Secondary | ICD-10-CM | POA: Diagnosis not present

## 2022-09-25 DIAGNOSIS — D225 Melanocytic nevi of trunk: Secondary | ICD-10-CM | POA: Diagnosis not present

## 2022-09-25 DIAGNOSIS — L814 Other melanin hyperpigmentation: Secondary | ICD-10-CM | POA: Diagnosis not present

## 2022-09-25 DIAGNOSIS — L821 Other seborrheic keratosis: Secondary | ICD-10-CM | POA: Diagnosis not present

## 2022-11-06 DIAGNOSIS — Z1231 Encounter for screening mammogram for malignant neoplasm of breast: Secondary | ICD-10-CM | POA: Diagnosis not present

## 2022-11-06 DIAGNOSIS — Z6825 Body mass index (BMI) 25.0-25.9, adult: Secondary | ICD-10-CM | POA: Diagnosis not present

## 2022-11-06 DIAGNOSIS — Z01419 Encounter for gynecological examination (general) (routine) without abnormal findings: Secondary | ICD-10-CM | POA: Diagnosis not present

## 2022-11-06 DIAGNOSIS — Z1151 Encounter for screening for human papillomavirus (HPV): Secondary | ICD-10-CM | POA: Diagnosis not present

## 2022-11-06 DIAGNOSIS — Z124 Encounter for screening for malignant neoplasm of cervix: Secondary | ICD-10-CM | POA: Diagnosis not present

## 2022-12-04 DIAGNOSIS — R6884 Jaw pain: Secondary | ICD-10-CM | POA: Diagnosis not present

## 2022-12-14 ENCOUNTER — Encounter: Payer: Self-pay | Admitting: Cardiology

## 2022-12-14 ENCOUNTER — Ambulatory Visit: Payer: BC Managed Care – PPO | Admitting: Cardiology

## 2022-12-14 VITALS — BP 136/82 | HR 61 | Resp 16 | Ht 66.0 in | Wt 158.6 lb

## 2022-12-14 DIAGNOSIS — I447 Left bundle-branch block, unspecified: Secondary | ICD-10-CM | POA: Diagnosis not present

## 2022-12-14 DIAGNOSIS — I428 Other cardiomyopathies: Secondary | ICD-10-CM | POA: Diagnosis not present

## 2022-12-14 DIAGNOSIS — I5022 Chronic systolic (congestive) heart failure: Secondary | ICD-10-CM | POA: Diagnosis not present

## 2022-12-14 MED ORDER — CARVEDILOL 12.5 MG PO TABS: 12.5000 mg | ORAL_TABLET | Freq: Two times a day (BID) | ORAL | 3 refills | Status: DC

## 2022-12-14 NOTE — Progress Notes (Signed)
Primary Physician/Referring:  Rodrigo Ran, MD  Patient ID: Monica Adams, female    DOB: [birth year], 62 y.o.   MRN: [id number]  Chief Complaint  Patient presents with   Chronic systolic heart failure   Follow-up     HPI:    Monica Adams  is a 62 y.o. Caucasian female patient with multiple members in the family having had MI in their early 65 years of age (Mother with MI in early 92s. Brother at age 57 had 2 coronary stents. Both maternal grandparents with CAD in 31s), hyperlipidemia, elevated LPA,  coronary calcium score done on 12/26/2017 in the 99 percentile, new onset LBBB in May 2023, new onset severe LV systolic dysfunction with EF of 25 to 35%, cardiac catheterization 12/31/2021 revealing very mild calcific plaque in the proximal LAD otherwise normal coronary arteries with EF 35% with global hypokinesis, moderate obstructive sleep apnea diagnosed on 04/18/2022, now on BiPAP, presently on guideline directed medical therapy.  Patient presents for a 60-monthmonth office visit, states that over the past 2 to 3 months she has not been able to exercise like she normally does and gets markedly dyspneic doing the same amount of physical activity that she was doing before.  She goes for a walk in the park with her friend and has been doing this for many years but lately she has slowed down and is having hard time to catch up to a friend.  Denies leg edema, denies PND or orthopnea.  No dizziness or syncope.  Past Medical History:  Diagnosis Date   Hyperlipidemia    LBBB (left bundle branch block)    Osteoporosis    Past Surgical History:  Procedure Laterality Date   kidney stone removal     RIGHT/LEFT HEART CATH AND CORONARY ANGIOGRAPHY N/A 01/10/2022   Procedure: RIGHT/LEFT HEART CATH AND CORONARY ANGIOGRAPHY;  Surgeon: Yates Decamp, MD;  Location: [facility];  Service: Cardiovascular;  Laterality: N/A;   Family History  Problem Relation Age of Onset   Heart attack Mother 20    Heart disease Mother    Kidney disease Mother    Alzheimer's disease Father    Diabetes Brother    Heart attack Maternal Grandmother 50   Heart disease Maternal Grandfather 50   Heart disease Half-Brother 57       stents   Sleep apnea Neg Hx     Social History   Tobacco Use   Smoking status: Former    Packs/day: 0.25    Years: 4.00    Additional pack years: 0.00    Total pack years: 1.00    Types: Cigarettes    Quit date: 1985    Years since quitting: 39.4   Smokeless tobacco: Never   Tobacco comments:    Smoked in 20's for about 4 years  Substance Use Topics   Alcohol use: Yes    Alcohol/week: 16.0 standard drinks of alcohol    Types: 8 Glasses of wine, 8 Shots of liquor per week    Comment: daily 1-2 glasses   Marital Status: Married  ROS  Review of Systems  Constitutional: Negative for malaise/fatigue.  Cardiovascular:  Negative for chest pain, dyspnea on exertion and leg swelling.  Respiratory:  Positive for snoring (On AutoPAP).    Objective  Blood pressure 136/82, pulse 61, resp. rate 16, height 5\' 6"  (1.676 m), weight 158 lb 9.6 oz (71.9 kg), SpO2 98 %. Body mass index is 25.6 kg/m.     12/14/2022  3:02 PM 07/18/2022    8:08 AM 06/08/2022    3:18 PM  Vitals with BMI  Height 5\' 6"  5\' 6"  5\' 6"   Weight 158 lbs 10 oz 159 lbs 13 oz 155 lbs  BMI 25.61 25.8 25.03  Systolic 136 97 131  Diastolic 82 58 78  Pulse 61 57 66    Physical Exam Neck:     Vascular: No JVD.  Cardiovascular:     Rate and Rhythm: Normal rate and regular rhythm.     Pulses: Intact distal pulses.     Heart sounds: Murmur heard.     Early systolic murmur is present with a grade of 2/6 at the upper right sternal border.     No gallop.  Pulmonary:     Effort: Pulmonary effort is normal.     Breath sounds: Normal breath sounds.  Abdominal:     General: Bowel sounds are normal.     Palpations: Abdomen is soft.  Musculoskeletal:     Right lower leg: No edema.     Left lower leg: No  edema.     Medications and allergies  No Known Allergies   Medication list after today's encounter   Current Outpatient Medications:    ibandronate (BONIVA) 150 MG tablet, Take 150 mg by mouth every 30 (thirty) days., Disp: , Rfl:    Multiple Vitamins-Minerals (ONE A DAY WOMEN 50 PLUS PO), Take 1 tablet by mouth daily., Disp: , Rfl:    nitroGLYCERIN (NITROSTAT) 0.4 MG SL tablet, Place 1 tablet (0.4 mg total) under the tongue every 5 (five) minutes as needed for up to 4 doses for chest pain., Disp: 4 tablet, Rfl: 0   REPATHA SURECLICK 140 MG/ML SOAJ, Inject 140 mg into the skin every 14 (fourteen) days., Disp: , Rfl:    rosuvastatin (CRESTOR) 20 MG tablet, Take 20 mg by mouth daily., Disp: , Rfl:    sacubitril-valsartan (ENTRESTO) 97-103 MG, Take 1 tablet by mouth 2 (two) times daily. (Patient taking differently: Take 0.5 tablets by mouth 2 (two) times daily.), Disp: 180 tablet, Rfl: 3   carvedilol (COREG) 12.5 MG tablet, Take 1 tablet (12.5 mg total) by mouth 2 (two) times daily with a meal., Disp: 180 tablet, Rfl: 3  Laboratory examination:   Recent Labs    01/06/22 1540 01/10/22 1656 01/10/22 1657 01/10/22 1702 02/21/22 1509  NA 140   < > 142 141 138  K 4.6   < > 4.1 4.0 4.3  CL 107*  --   --   --  102  CO2 19*  --   --   --  25  GLUCOSE 102*  --   --   --  139*  BUN 24  --   --   --  19  CREATININE 0.76  --   --   --  0.70  CALCIUM 9.5  --   --   --  10.3*   < > = values in this interval not displayed.      Latest Ref Rng & Units 02/21/2022    3:09 PM 01/10/2022    5:02 PM 01/10/2022    4:57 PM  CMP  Glucose 70 - 99 mg/dL 914     BUN 6 - 24 mg/dL 19     Creatinine 7.82 - 1.00 mg/dL 9.56     Sodium 213 - 086 mmol/L 138  141  142   Potassium 3.5 - 5.2 mmol/L 4.3  4.0  4.1   Chloride  96 - 106 mmol/L 102     CO2 20 - 29 mmol/L 25     Calcium 8.7 - 10.2 mg/dL 16.1         Latest Ref Rng & Units 01/10/2022    5:02 PM 01/10/2022    4:57 PM 01/10/2022    4:56 PM  CBC   Hemoglobin 12.0 - 15.0 g/dL 09.6  04.5  40.9   Hematocrit 36.0 - 46.0 % 38.0  39.0  39.0    BNP (last 3 results) Recent Labs    01/06/22 1540  BNP 43.2    ProBNP (last 3 results) No results for input(s): "PROBNP" in the last 8760 hours.  External labs:   Labs done 05/15/2021:  BUN 9, creatinine 0.8, EGFR 99.6, potassium 4.8, LFTs normal.  Hb 13.1/HCT 39.0, platelets 256, normal indicis.  Total cholesterol 137, triglycerides 34, HDL 85, LDL 45.  TSH normal at 1.00, vitamin D 36.7.  Radiology:    Cardiac Studies:   Coronary calcium score 12/26/2017: Calcium score: 74.  LM: 0.  LAD: 74.  Cx: 0.  RCA: 0   Treadmill exercise stress test 03/04/2018 Indication: screening for CAD The patient exercised on Bruce protocol for  12:11 min. Patient achieved  13.60 METS and reached HR  182 bpm, which is  109 % of maximum age-predicted HR.  Stress test terminated due to fatigue. Resting EKG demonstrates Normal sinus rhythm. ST Changes: With peak exercise there was no ST-T changes of ischemia. Arrhythmias: none. Chest Pain: none. BP Response to Exercise: Normal resting BP- appropriate response. HR Response to Exercise: Appropriate.  Exercise capacity was above average for age. Continue primary/secondary prevention.  PCV MYOCARDIAL PERFUSION WITH LEXISCAN 12/07/2021  Narrative Lexiscan Nuclear stress test 12/07/2021: Nondiagnostic ECG stress. ECG demonstrated normal sinus rhythm with left bundle branch block. The heart rate and BP response was consistent with Regadenoson. Myocardial perfusion is abnormal. There is a fixed mild defect in the anterior and anteroseptal and apical region. Overall LV systolic function is moderately abnormal without regional wall motion abnormalities and there is global hypokinesis. Stress LV EF: 34%. Findings may be secondary to LBBB. Correlate with echocardiogram. No previous exam available for comparison. High risk study.   PCV ECHOCARDIOGRAM  COMPLETE 11/23/2021  Narrative Echocardiogram 11/23/2021: Left ventricle cavity is normal in size. Mild concentric hypertrophy of the left ventricle. Abnormal septal wall motion due to left bundle branch block. Severe global hypokinesis. LVEF 25-30%. Apec not well visualized. Doppler evidence of grade I (impaired) diastolic dysfunction, normal LAP. Mild (Grade I) mitral regurgitation. Normal right atrial pressure.    Right & Left Heart Catheterization 01/10/22: RA 3/2/, mean 0 mmHg. RV 16/-3, EDP 12 mmHg. PA 17/2, mean 8 mmHg.  PA saturation 72%. PW 7/6, mean 5 mmHg. LV: 25/2, EDP 19 mmHg.  Ao 129/63, mean 86 mmHg.  Aortic saturation 93%. CO 5.92, CI 3.3 by Fick.  QP/QS 1.00.  LV: Global hypokinesis.  EF 35% approximately. LM: Short.  Bifurcates into LAD and CX. LAD: Very large caliber vessel, smooth and normal.  Gives origin to 3 large diagonals.  Proximal to mid segment of the LAD has a calcific shelf on the inferior margin without any luminal obstruction.  LAD itself is very smooth and normal otherwise. CX: Dominant vessel.  Gives origin to moderate-sized OM1 and 2 and continues in the AV groove and gives origin to PDA branches.  Smooth and normal. RCA: Small and nondominant.  Sleep study 04/18/2022: Moderate obstructive sleep apnea with a  total AHI of 26.2/hour and O2 nadir of 81% now treated with BiPAP.  PCV ECHOCARDIOGRAM COMPLETE 06/22/2022  Narrative Echocardiogram 06/22/2022: Left ventricle cavity is normal in size. Normal left ventricular wall thickness. Abnormal septal wall motion due to left bundle branch block. Normal diastolic filling pattern. Hypokinetic global wall motion. Moderately depressed LV systolic function with visual EF 30-35%. Structurally normal mitral valve.  Mild (Grade I) mitral regurgitation. Structurally normal tricuspid valve with trace regurgitation. No evidence of pulmonary hypertension. Compared to the study done on 11/23/2021, EF is markedly improved  from 25 to 30% to the present 30 to 35%.   EKG:   EKG 12/14/2022: Normal sinus rhythm at rate of 50 bpm, left atrial enlargement, left bundle branch block.  No further analysis.  No significant change from 06/08/2022.  Assessment     ICD-10-CM   1. Non-ischemic cardiomyopathy (HCC)  I42.8     2. Chronic systolic heart failure (HCC)  U04.54 EKG 12-Lead    carvedilol (COREG) 12.5 MG tablet    Ambulatory referral to Cardiac Electrophysiology    CBC    Basic metabolic panel    Pro b natriuretic peptide (BNP)    3. LBBB (left bundle branch block)  I44.7 Ambulatory referral to Cardiac Electrophysiology       Medications Discontinued During This Encounter  Medication Reason   carvedilol (COREG) 6.25 MG tablet Dose change   carvedilol (COREG) 12.5 MG tablet Reorder     Meds ordered this encounter  Medications   carvedilol (COREG) 12.5 MG tablet    Sig: Take 1 tablet (12.5 mg total) by mouth 2 (two) times daily with a meal.    Dispense:  180 tablet    Refill:  3   Orders Placed This Encounter  Procedures   CBC   Basic metabolic panel   Pro b natriuretic peptide (BNP)   Ambulatory referral to Cardiac Electrophysiology    Referral Priority:   Routine    Referral Type:   Consultation    Referral Reason:   Specialty Services Required    Requested Specialty:   Cardiology    Number of Visits Requested:   1   EKG 12-Lead    Recommendations:   AZLEE BOESEN is a 62 y.o.  Caucasian female patient with multiple members in the family having had MI in their early 16 years of age (Mother with MI in early 53s. Brother at age 11 had 2 coronary stents. Both maternal grandparents with CAD in 64s), hyperlipidemia, elevated LPA,  coronary calcium score done on 12/26/2017 in the 99 percentile, new onset LBBB in May 2023, new onset severe LV systolic dysfunction with EF of 25 to 35%, cardiac catheterization 12/31/2021 revealing very mild calcific plaque in the proximal LAD otherwise normal  coronary arteries with EF 35% with global hypokinesis, moderate obstructive sleep apnea diagnosed on 04/18/2022, now on BiPAP, presently on guideline directed medical therapy.  1. Non-ischemic cardiomyopathy (HCC) Patient with nonischemic cardiomyopathy related to left bundle branch block, no clinical evidence of acute decompensated heart failure.  2. Chronic systolic heart failure (HCC) Patient does not develop symptomatic heart failure by reduced physical activity.  She has class II symptoms of dyspnea and noticed this over the past 2 to 3 months.  I reviewed her echocardiogram, although echocardiogram read as EF 30 to 35%, in reality the EF on side-by-side analysis appears to be 25 to 30%.  I am concerned that in spite of guideline directed medical therapy, Entresto 49/51 mg dose  and Coreg at 12.5 mg twice daily for a year she has not improved in her EF and hence will refer for consideration for CRT-P/CRT-D.  Advised the patient increase Entresto to full dose, 97/103 mg twice daily (presently taking 1/2 tablet twice daily due to dizziness and low blood pressure) to see whether she would tolerate this.  Referral made for EP evaluation. - EKG 12-Lead - carvedilol (COREG) 12.5 MG tablet; Take 1 tablet (12.5 mg total) by mouth 2 (two) times daily with a meal.  Dispense: 180 tablet; Refill: 3 - Ambulatory referral to Cardiac Electrophysiology - CBC - Basic metabolic panel - Pro b natriuretic peptide (BNP)  3. LBBB (left bundle branch block) Patient has underlying left bundle branch block, will be a good candidate for recent radiation therapy and hopefully will improve her LVEF and functional status.  Office visit in 3 months. - Ambulatory referral to Cardiac Electrophysiology  I have extensively discussed with the patient regarding the options of therapy, discussed BiV pacing mechanism, potential risks and benefits.  This was a 40-minute office visit encounter.    Yates Decamp, MD,  [facility] 12/14/2022, 4:01 PM Office: [phone]

## 2022-12-18 ENCOUNTER — Other Ambulatory Visit: Payer: Self-pay | Admitting: Cardiology

## 2022-12-18 DIAGNOSIS — I5022 Chronic systolic (congestive) heart failure: Secondary | ICD-10-CM

## 2022-12-26 ENCOUNTER — Encounter: Payer: Self-pay | Admitting: Cardiology

## 2022-12-26 NOTE — Telephone Encounter (Signed)
From pt

## 2023-01-05 DIAGNOSIS — I5022 Chronic systolic (congestive) heart failure: Secondary | ICD-10-CM | POA: Diagnosis not present

## 2023-01-06 LAB — CBC
Hematocrit: 39.3 % (ref 34.0–46.6)
Hemoglobin: 13.3 g/dL (ref 11.1–15.9)
MCH: 31.3 pg (ref 26.6–33.0)
MCHC: 33.8 g/dL (ref 31.5–35.7)
MCV: 93 fL (ref 79–97)
Platelets: 271 10*3/uL (ref 150–450)
RBC: 4.25 x10E6/uL (ref 3.77–5.28)
RDW: 12.6 % (ref 11.7–15.4)
WBC: 3.9 10*3/uL (ref 3.4–10.8)

## 2023-01-06 LAB — BASIC METABOLIC PANEL
BUN/Creatinine Ratio: 28 — ABNORMAL HIGH (ref 9–23)
BUN: 20 mg/dL (ref 6–24)
CO2: 23 mmol/L (ref 20–29)
Calcium: 9.6 mg/dL (ref 8.7–10.2)
Chloride: 101 mmol/L (ref 96–106)
Creatinine, Ser: 0.71 mg/dL (ref 0.57–1.00)
Glucose: 71 mg/dL (ref 70–99)
Potassium: 4.7 mmol/L (ref 3.5–5.2)
Sodium: 138 mmol/L (ref 134–144)
eGFR: 98 mL/min/{1.73_m2} (ref >59)

## 2023-01-06 LAB — PRO B NATRIURETIC PEPTIDE: NT-Pro BNP: <36 pg/mL (ref 0–287)

## 2023-01-22 NOTE — Progress Notes (Unsigned)
Electrophysiology Office Note:    Date:  01/24/2023   ID:  Monica Adams, DOB 09/21/1960, MRN [id number]  [facility] Cardiologist:  None  [facility] Electrophysiologist:  Lanier Prude, MD   Referring MD: Yates Decamp, MD   Chief Complaint: HFrEF  History of Present Illness:    Monica Adams is a 62 y.o. femalewho I am seeing today for an evaluation of HFrEF and LBBB at the request of Dr Jacinto Halim.  The patient was last seen by Dr Jacinto Halim 12/14/2022.  The patient has a medical history that includes HLD, LBBB, HFrEF (E25), OSA on BiPAP.  At the last appt with JG, she reported fatigue with minimal exertion.   Today she is with her husband in clinic.  She confirms the above history.  She reports fatigue with minimal exertion.  No syncope.        Their past medical, social and family history was reveiwed.   ROS:   Please see the history of present illness.    All other systems reviewed and are negative.  EKGs/Labs/Other Studies Reviewed:    The following studies were reviewed today:  Echocardiogram 06/22/2022:   Left ventricle cavity is normal in size. Normal left ventricular wall  thickness. Abnormal septal wall motion due to left bundle branch block.  Normal diastolic filling pattern. Hypokinetic global wall motion.  Moderately depressed LV systolic function with visual EF 30-35%.  Structurally normal mitral valve.  Mild (Grade I) mitral regurgitation.  Structurally normal tricuspid valve with trace regurgitation. No evidence  of pulmonary hypertension.   01/10/2022 LHC/RHC Impression: Normal right heart catheterization with preserved cardiac output and cardiac index. Findings are consistent with nonischemic cardiomyopathy. 40 mL contrast utilized. Sedation time 25 minutes.   12/14/2022 ECG shows sinus with LBBB and QRS duration .        Physical Exam:    VS:  BP 114/76 (BP Location: Left Arm, Patient Position: Sitting, Cuff Size: Normal)    Pulse (!) 52   Ht 5\' 6"  (1.676 m)   Wt 157 lb 9.6 oz (71.5 kg)   SpO2 98%   BMI 25.44 kg/m     Wt Readings from Last 3 Encounters:  01/24/23 157 lb 9.6 oz (71.5 kg)  12/14/22 158 lb 9.6 oz (71.9 kg)  07/18/22 159 lb 12.8 oz (72.5 kg)     GEN:  Well nourished, well developed in no acute distress CARDIAC: RRR, no murmurs, rubs, gallops RESPIRATORY:  Clear to auscultation without rales, wheezing or rhonchi       ASSESSMENT AND PLAN:    1. Non-ischemic cardiomyopathy (HCC)   2. Chronic systolic heart failure (HCC)   3. LBBB (left bundle branch block)   4. OSA on CPAP     #HFrEF #NICM #LBBB NYHA II. Warm and dry on exam. Cont coreg, entresto Discussed her LBBB and role for CRT-D for her during today's visit.  The patient has an non ischemic CM (EF 25-30%), NYHA Class III CHF, and CAD.  He is referred by Dr Jacinto Halim for risk stratification of sudden death and consideration of ICD implantation.  At this time, she meets criteria for ICD implantation for primary prevention of sudden death.  I have had a thorough discussion with the patient reviewing options.  The patient and their family (if available) have had opportunities to ask questions and have them answered. The patient and I have decided together through a shared decision making process to proceed with ICD implant at this time.  Risks, benefits, alternatives to ICD implantation were discussed in detail with the patient today. The patient understands that the risks include but are not limited to bleeding, infection, pneumothorax, perforation, tamponade, vascular damage, renal failure, MI, stroke, death, inappropriate shocks, and lead dislodgement and wishes to proceed.  We will therefore schedule device implantation at the next available time.  Plan for [facility] Jude CRT-D.  Prior to device implant she will need a cardiac MRI to assess the LV myocardium, burden of LGE and reassess the ejection fraction.  #OSA Cont  CPAP      Signed, Sheria Lang T. Lalla Brothers, MD, [facility], [facility] 01/24/2023 8:33 AM    Electrophysiology [facility]

## 2023-01-24 ENCOUNTER — Ambulatory Visit: Payer: BC Managed Care – PPO | Attending: Cardiology | Admitting: Cardiology

## 2023-01-24 ENCOUNTER — Encounter: Payer: Self-pay | Admitting: Cardiology

## 2023-01-24 VITALS — BP 114/76 | HR 52 | Ht 66.0 in | Wt 157.6 lb

## 2023-01-24 DIAGNOSIS — G4733 Obstructive sleep apnea (adult) (pediatric): Secondary | ICD-10-CM | POA: Diagnosis not present

## 2023-01-24 DIAGNOSIS — I447 Left bundle-branch block, unspecified: Secondary | ICD-10-CM

## 2023-01-24 DIAGNOSIS — I5022 Chronic systolic (congestive) heart failure: Secondary | ICD-10-CM

## 2023-01-24 DIAGNOSIS — I428 Other cardiomyopathies: Secondary | ICD-10-CM

## 2023-01-24 NOTE — Addendum Note (Signed)
Addended by: Frutoso Schatz on: 01/24/2023 10:28 AM   Modules accepted: Orders

## 2023-01-24 NOTE — Addendum Note (Signed)
Addended by: Darene Lamer T on: 01/24/2023 09:06 AM   Modules accepted: Orders

## 2023-01-24 NOTE — Patient Instructions (Signed)
Medication Instructions:  The current medical regimen is effective;  continue present plan and medications.  *If you need a refill on your cardiac medications before your next appointment, please call your pharmacy*   Lab Work: Your provider would like for you to have following labs drawn today CBC.   If you have labs (blood work) drawn today and your tests are completely normal, you will receive your results only by: MyChart Message (if you have MyChart) OR A paper copy in the mail If you have any lab test that is abnormal or we need to change your treatment, we will call you to review the results.   Testing/Procedures:   Please arrive for your appointment at ______________ ( arrive 30-45 minutes prior to test start time). ?  [facility] [address] [place], [place] [address] [phone] Please take advantage of the free valet parking available at the [facility] and [employer] (Entrance C).  Proceed to the [facility] Radiology Department (First Floor) for check-in.   OR   [facility] [address] [place], [place] [address] [phone] Please go to the [facility] and check-in with the desk attendant.   Magnetic resonance imaging (MRI) is a painless test that produces images of the inside of the body without using Xrays.  During an MRI, strong magnets and radio waves work together in a Data processing manager to form detailed images.   MRI images may provide more details about a medical condition than X-rays, CT scans, and ultrasounds can provide.  You may be given earphones to listen for instructions.  You may eat a light breakfast and take medications as ordered with the exception of furosemide, hydrochlorothiazide, or spironolactone(fluid pill, other). Please avoid stimulants for 12 hr prior to test. (Ie. Caffeine, nicotine, chocolate, or antihistamine medications)  If a contrast material will be used, an IV will be  inserted into one of your veins. Contrast material will be injected into your IV. It will leave your body through your urine within a day. You may be told to drink plenty of fluids to help flush the contrast material out of your system.  You will be asked to remove all metal, including: Watch, jewelry, and other metal objects including hearing aids, hair pieces and dentures. Also wearable glucose monitoring systems (ie. Freestyle Libre and Omnipods) (Braces and fillings normally are not a problem.)   TEST WILL TAKE APPROXIMATELY 1 HOUR  PLEASE NOTIFY SCHEDULING AT LEAST 24 HOURS IN ADVANCE IF YOU ARE UNABLE TO KEEP YOUR APPOINTMENT. [phone]  For more information and frequently asked questions, please visit our website : [email or web address]  Please call Rockwell [place], cardiac imaging nurse navigator with any questions/concerns. Cardiac Imaging Nurse Navigators [facility] [phone] Office    Your physician has recommended that you have a defibrillator inserted. An implantable cardioverter defibrillator (ICD) is a small device that is placed in your chest or, in rare cases, your abdomen. This device uses electrical pulses or shocks to help control life-threatening, irregular heartbeats that could lead the heart to suddenly stop beating (sudden cardiac arrest). Leads are attached to the ICD that goes into your heart. This is done in the hospital and usually requires an overnight stay. Please see the instruction sheet given to you today for more information.   Follow-Up: At [facility], you and your health needs are our priority.  As part of our continuing mission to provide you with exceptional heart care,  we have created designated Provider Care Teams.  These Care Teams include your primary Cardiologist (physician) and Advanced Practice Providers (APPs -  Physician Assistants and Nurse Practitioners) who all work together to provide you  with the care you need, when you need it.  We recommend signing up for the patient portal called "MyChart".  Sign up information is provided on this After Visit Summary.  MyChart is used to connect with patients for Virtual Visits (Telemedicine).  Patients are able to view lab/test results, encounter notes, upcoming appointments, etc.  Non-urgent messages can be sent to your provider as well.   To learn more about what you can do with MyChart, go to [email or web address].    Your next appointment:   We will call to arrange follow up  Provider:   Steffanie Dunn, MD

## 2023-01-31 ENCOUNTER — Other Ambulatory Visit: Payer: Self-pay | Admitting: Cardiology

## 2023-01-31 ENCOUNTER — Ambulatory Visit (HOSPITAL_COMMUNITY)
Admission: RE | Admit: 2023-01-31 | Discharge: 2023-01-31 | Disposition: A | Discharge status: Home / Self Care | Payer: BC Managed Care – PPO | Source: Ambulatory Visit | Attending: Cardiology | Admitting: Cardiology

## 2023-01-31 DIAGNOSIS — I428 Other cardiomyopathies: Secondary | ICD-10-CM

## 2023-01-31 DIAGNOSIS — I5022 Chronic systolic (congestive) heart failure: Secondary | ICD-10-CM

## 2023-01-31 MED ORDER — GADOBUTROL 1 MMOL/ML IV SOLN
8.0000 mL | Freq: Once | INTRAVENOUS | Status: AC | PRN
  Administered 2023-01-31: 8 mL via INTRAVENOUS

## 2023-02-04 NOTE — Progress Notes (Signed)
Cardiac MR 02/02/2023: 1. Mildly reduced LV systolic function with mild left ventricular dilation. LVEF 46%. Abnormal septal motion secondary to conduction delay. 2. Low normal right ventricular systolic function with normal right ventricular size. RVEF 49%. 3. No delayed myocardial enhancement. No myocardial edema. ECV 30%, nonspecific elevation. No findings of infarct, inflammation, or infiltrative process. 4.  No hemodynamically significant valvular heart disease.

## 2023-02-14 ENCOUNTER — Encounter: Payer: Self-pay | Admitting: Cardiology

## 2023-02-14 ENCOUNTER — Ambulatory Visit: Payer: BC Managed Care – PPO | Attending: Cardiology | Admitting: Cardiology

## 2023-02-14 VITALS — BP 112/74 | HR 79 | Ht 66.0 in | Wt 155.0 lb

## 2023-02-14 DIAGNOSIS — I5022 Chronic systolic (congestive) heart failure: Secondary | ICD-10-CM | POA: Diagnosis not present

## 2023-02-14 DIAGNOSIS — I447 Left bundle-branch block, unspecified: Secondary | ICD-10-CM

## 2023-02-14 NOTE — Progress Notes (Signed)
   Virtual Visit via Video Note   Because of ESHA SIRKIN co-morbid illnesses, she is at least at moderate risk for complications without adequate follow up.  This format is felt to be most appropriate for this patient at this time.  All issues noted in this document were discussed and addressed.  A limited physical exam was performed with this format.  Please refer to the patient's chart for her consent to telehealth for [facility].       Date:  02/14/2023   ID:  Monica Adams, DOB 09-08-1960, MRN [id number] The patient was identified using 2 identifiers.  Patient Location: Home Provider Location: Office/Clinic   PCP:  Rodrigo Ran, MD   [facility] Providers Cardiologist:  None Electrophysiologist:  Lanier Prude, MD     Evaluation Performed:  Follow-Up Visit  Chief Complaint:  HFrEF  History of Present Illness:    Monica Adams is a 62 y.o. female with chronic systolic heart failure left bundle branch block who I am seeing in follow-up.  At the last appointment we discussed a CRT-D implant for her nonischemic cardiomyopathy and left bundle branch block.  I ordered a cardiac MRI at that appointment.  Results of the cardiac MRI showed an improved ejection fraction on guideline directed medical therapy.  Since I last saw her she has been doing okay.  She thinks that her GDMT makes her fatigued.  No syncope or presyncope.  Past medical, surgical, family, social histories reviewed.  ROS:   Please see the history of present illness.    wAll other systems reviewed and are negative.   Prior CV studies:   The following studies were reviewed today:  Cardiac MRI personally reviewed.  EF 46%.      Objective:    Vital Signs:  BP 112/74   Pulse 79   Ht 5\' 6"  (1.676 m)   Wt 155 lb (70.3 kg)   BMI 25.02 kg/m    VITAL SIGNS:  reviewed  ASSESSMENT & PLAN:    #Chronic systolic heart failure #Nonischemic cardiomyopathy #Left bundle branch  block NYHA class II.  EF 46% by cardiac MRI. Continue GDMT with Entresto and Coreg.  Follows with Dr. Jacinto Halim. No indication currently for ICD therapy.  Recommend continued close follow-up with general cardiology with intermittent monitoring of her ejection fraction.  If she were to have a recurrence of reduced ejection fraction, favor proceeding with CRT-D implant.   I will be available as needed.       Time:   Today, I have spent 12 minutes with the patient with telehealth technology discussing the above problems.      Follow Up:  PRN  Signed, Lanier Prude, MD  02/14/2023 4:37 PM    [facility]

## 2023-02-14 NOTE — Patient Instructions (Signed)
Medication Instructions:  Your physician recommends that you continue on your current medications as directed. Please refer to the Current Medication list given to you today.  *If you need a refill on your cardiac medications before your next appointment, please call your pharmacy*  Follow-Up: At [facility], you and your health needs are our priority.  As part of our continuing mission to provide you with exceptional heart care, we have created designated Provider Care Teams.  These Care Teams include your primary Cardiologist (physician) and Advanced Practice Providers (APPs -  Physician Assistants and Nurse Practitioners) who all work together to provide you with the care you need, when you need it.  Your next appointment:   As needed with Dr. Lambert 

## 2023-03-01 DIAGNOSIS — M549 Dorsalgia, unspecified: Secondary | ICD-10-CM | POA: Diagnosis not present

## 2023-03-05 ENCOUNTER — Other Ambulatory Visit: Payer: BC Managed Care – PPO

## 2023-03-08 ENCOUNTER — Ambulatory Visit: Payer: BC Managed Care – PPO | Admitting: Cardiology

## 2023-03-13 ENCOUNTER — Encounter (HOSPITAL_COMMUNITY): Payer: Self-pay

## 2023-03-13 ENCOUNTER — Ambulatory Visit (HOSPITAL_COMMUNITY): Admit: 2023-03-13 | Payer: BC Managed Care – PPO | Admitting: Cardiology

## 2023-03-13 SURGERY — BIV ICD INSERTION CRT-D

## 2023-03-15 ENCOUNTER — Ambulatory Visit: Payer: BC Managed Care – PPO | Admitting: Cardiology

## 2023-03-29 DIAGNOSIS — Z13820 Encounter for screening for osteoporosis: Secondary | ICD-10-CM | POA: Diagnosis not present

## 2023-03-30 ENCOUNTER — Other Ambulatory Visit: Payer: Self-pay | Admitting: Cardiology

## 2023-03-30 DIAGNOSIS — I5022 Chronic systolic (congestive) heart failure: Secondary | ICD-10-CM

## 2023-04-02 ENCOUNTER — Ambulatory Visit: Payer: BC Managed Care – PPO | Admitting: Cardiology

## 2023-04-16 ENCOUNTER — Other Ambulatory Visit (HOSPITAL_COMMUNITY): Payer: BC Managed Care – PPO

## 2023-05-10 DIAGNOSIS — I2510 Atherosclerotic heart disease of native coronary artery without angina pectoris: Secondary | ICD-10-CM | POA: Diagnosis not present

## 2023-05-28 ENCOUNTER — Telehealth: Payer: Self-pay | Admitting: Cardiology

## 2023-05-28 NOTE — Telephone Encounter (Signed)
Left message to call office

## 2023-05-28 NOTE — Telephone Encounter (Signed)
Pt called in stating she believes she needs to be seen sooner. She declined appt with APP. She states she could not get a pacemaker. I asked her what type of symptoms is she having to let the nurse know she stated "I'm not having anything right now. My arrhythmia is fine I just think I need to be seen sooner. I don't feel well."   Please advise. She is on the waitlist.

## 2023-05-30 NOTE — Telephone Encounter (Signed)
Pt returning nurses phone call. Please advise ?

## 2023-05-30 NOTE — Telephone Encounter (Signed)
I spoke with patient.  She currently has appointment in March and would like to be seen sooner.  States she has not been feeling well for several months.  Patient reports she was recently started on Wegovy by her PCP.   I offered patient appointment for 06/19/23 but she is unable to come in that day.  Appointment made for patient to see Dr Jacinto Halim on 07/25/23

## 2023-06-28 ENCOUNTER — Encounter: Payer: Self-pay | Admitting: Cardiology

## 2023-06-28 ENCOUNTER — Ambulatory Visit: Payer: BC Managed Care – PPO | Attending: Cardiology | Admitting: Cardiology

## 2023-06-28 VITALS — BP 104/68 | HR 69 | Ht 66.0 in | Wt 156.4 lb

## 2023-06-28 DIAGNOSIS — I5022 Chronic systolic (congestive) heart failure: Secondary | ICD-10-CM | POA: Diagnosis not present

## 2023-06-28 DIAGNOSIS — I447 Left bundle-branch block, unspecified: Secondary | ICD-10-CM

## 2023-06-28 DIAGNOSIS — G4733 Obstructive sleep apnea (adult) (pediatric): Secondary | ICD-10-CM

## 2023-06-28 DIAGNOSIS — I428 Other cardiomyopathies: Secondary | ICD-10-CM

## 2023-06-28 MED ORDER — CARVEDILOL 6.25 MG PO TABS: 6.2500 mg | ORAL_TABLET | Freq: Two times a day (BID) | ORAL | Status: DC

## 2023-06-28 NOTE — Progress Notes (Signed)
 Cardiology Office Note:  .   Date:  06/28/2023  ID:  Monica Adams, DOB March 04, 1961, MRN [id number] PCP: Shayne Anes, MD  [facility] Providers Cardiologist:  Gordy Bergamo, MD Electrophysiologist:  OLE ONEIDA HOLTS, MD   History of Present Illness: .   Monica Adams is a 63 y.o. Caucasian female patient with multiple members in the family having had MI in their early 47 years of age (Mother with MI in early 65s. Brother at age 26 had 2 coronary stents. Both maternal grandparents with CAD in 71s), hyperlipidemia, elevated LPA,  coronary calcium score done on 12/26/2017 in the 99 percentile, new onset LBBB in May 2023, new onset severe LV systolic dysfunction with EF of 25 to 35%, cardiac catheterization 12/31/2021 revealing very mild calcific plaque in the proximal LAD otherwise normal coronary arteries with EF 35% with global hypokinesis, moderate obstructive sleep apnea diagnosed on 04/18/2022, now on BiPAP, presently on guideline directed medical therapy.  She was seen by Dr. Orysia Holts, who recommended cardiac MR to reevaluate LV systolic function as she was on guideline directed medical therapy and this was performed on 01/31/2023 revealing improved LVEF to around 46%.  Hence CRT-D felt not necessary.  She has not been feeling well and wanted to be seen sooner.  Discussed the use of AI scribe software for clinical note transcription with the patient, who gave verbal consent to proceed.  History of Present Illness   The patient, with a history of nonischemic cardiomyopathy with severe LV systolic dysfunction and sleep apnea, presents with a chief complaint of feeling weak and not feeling like herself. She reports a decrease in her exercise routine and increased sleep. She also reports feeling lightheaded in the morning, especially after taking her medication. The patient is currently on carvedilol  and Entresto  for heart issues and has recently started [facility]. The patient's heart function  has improved from 25% to 45% due to medication. The patient has a history of sleep apnea, managed by Dr. Lurene.  States that she is now waking up not as fresh like she used to in the past.  Overall she feels lack of energy.  She has not had any syncope, palpitations, chest pain, PND or orthopnea.     Labs    Lab Results  Component Value Date   NA 138 01/05/2023   K 4.7 01/05/2023   CO2 23 01/05/2023   GLUCOSE 71 01/05/2023   BUN 20 01/05/2023   CREATININE 0.71 01/05/2023   CALCIUM 9.6 01/05/2023   EGFR 98 01/05/2023   GFRNONAA >60 11/08/2021      Latest Ref Rng & Units 01/05/2023   10:59 AM 02/21/2022    3:09 PM 01/10/2022    5:02 PM  BMP  Glucose 70 - 99 mg/dL 71  860    BUN 6 - 24 mg/dL 20  19    Creatinine 9.42 - 1.00 mg/dL 9.28  9.29    BUN/Creat Ratio 9 - 23 28  27     Sodium 134 - 144 mmol/L 138  138  141   Potassium 3.5 - 5.2 mmol/L 4.7  4.3  4.0   Chloride 96 - 106 mmol/L 101  102    CO2 20 - 29 mmol/L 23  25    Calcium 8.7 - 10.2 mg/dL 9.6  89.6        Latest Ref Rng & Units 01/05/2023   10:59 AM 01/10/2022    5:02 PM 01/10/2022    4:57 PM  CBC  WBC 3.4 - 10.8 x10E3/uL 3.9     Hemoglobin 11.1 - 15.9 g/dL 86.6  87.0  86.6   Hematocrit 34.0 - 46.6 % 39.3  38.0  39.0   Platelets 150 - 450 x10E3/uL 271      Review of Systems  Constitutional: Positive for malaise/fatigue.  Cardiovascular:  Negative for chest pain, dyspnea on exertion and leg swelling.    Physical Exam:   VS:  BP 104/68   Pulse 69   Ht 5' 6 (1.676 m)   Wt 156 lb 6.4 oz (70.9 kg)   SpO2 97%   BMI 25.24 kg/m    Wt Readings from Last 3 Encounters:  06/28/23 156 lb 6.4 oz (70.9 kg)  02/14/23 155 lb (70.3 kg)  01/24/23 157 lb 9.6 oz (71.5 kg)     Physical Exam Neck:     Vascular: No carotid bruit or JVD.  Cardiovascular:     Rate and Rhythm: Normal rate and regular rhythm.     Pulses: Intact distal pulses.     Heart sounds: Murmur heard.     Early systolic murmur is present with a grade  of 2/6 at the upper right sternal border.     No gallop.  Pulmonary:     Effort: Pulmonary effort is normal.     Breath sounds: Normal breath sounds.  Abdominal:     General: Bowel sounds are normal.     Palpations: Abdomen is soft.  Musculoskeletal:     Right lower leg: No edema.     Left lower leg: No edema.     Studies Reviewed: SABRA    CARDIAC CATHETERIZATION 01/10/2022  LV: Global hypokinesis.  EF 35% approximately. LM: Short.  Bifurcates into LAD and CX. LAD: Very large caliber vessel, smooth and normal.  Gives origin to 3 large diagonals.  Proximal to mid segment of the LAD has a calcific shelf on the inferior margin without any luminal obstruction.  LAD itself is very smooth and normal otherwise. CX: Dominant vessel.  Gives origin to moderate-sized OM1 and 2 and continues in the AV groove and gives origin to PDA branches.  Smooth and normal. RCA: Small and nondominant.  Echocardiogram 06/22/2022: Left ventricle cavity is normal in size. Normal left ventricular wall thickness. Abnormal septal wall motion due to left bundle branch block. Normal diastolic filling pattern. Hypokinetic global wall motion. Moderately depressed LV systolic function with visual EF 30-35%. Structurally normal mitral valve.  Mild (Grade I) mitral regurgitation. Structurally normal tricuspid valve with trace regurgitation. No evidence of pulmonary hypertension. Compared to the study done on 11/23/2021, EF is markedly improved from 25 to 30% to the present 30 to 35%.  MR CARDIAC MORPHOLOGY W WO CONTRAST 01/31/2023  1. Mildly reduced LV systolic function with mild left ventricular dilation. LVEF 46%. Abnormal septal motion secondary to conduction delay.  2. Low normal right ventricular systolic function with normal right ventricular size. RVEF 49%.  3. No delayed myocardial enhancement. No myocardial edema. ECV 30%, nonspecific elevation. No findings of infarct, inflammation, or infiltrative process.   4.  No  hemodynamically significant valvular heart disease.  EKG:    EKG Interpretation Date/Time:  Thursday June 28 2023 08:57:46 EST Ventricular Rate:  65 PR Interval:  194 QRS Duration:  164 QT Interval:  444 QTC Calculation: 461 R Axis:   -4  Text Interpretation: EKG 06/28/2023: Normal sinus rhythm at rate of 65 bpm, left bundle branch block.  Compared to 01/10/2022, no significant change.  Confirmed by Zi Newbury, Jagadeesh [phone]) on 06/28/2023 9:11:21 AM    EKG 12/14/2022: Normal sinus rhythm at rate of 50 bpm, left atrial enlargement, left bundle branch block. No further analysis.   Medications and allergies    No Known Allergies   Current Outpatient Medications:    carvedilol  (COREG ) 6.25 MG tablet, Take 1 tablet (6.25 mg total) by mouth 2 (two) times daily., Disp: , Rfl:    ENTRESTO  97-103 MG, TAKE 1 TABLET BY MOUTH TWICE DAILY, Disp: 180 tablet, Rfl: 3   ibandronate (BONIVA) 150 MG tablet, Take 150 mg by mouth every 30 (thirty) days., Disp: , Rfl:    Multiple Vitamins-Minerals (ONE A DAY WOMEN 50 PLUS PO), Take 1 tablet by mouth daily., Disp: , Rfl:    nitroGLYCERIN  (NITROSTAT ) 0.4 MG SL tablet, Place 1 tablet (0.4 mg total) under the tongue every 5 (five) minutes as needed for up to 4 doses for chest pain., Disp: 4 tablet, Rfl: 0   REPATHA SURECLICK 140 MG/ML SOAJ, Inject 140 mg into the skin every 14 (fourteen) days., Disp: , Rfl:    rosuvastatin (CRESTOR) 20 MG tablet, Take 20 mg by mouth daily., Disp: , Rfl:    WEGOVY 0.5 MG/0.5ML SOAJ, Inject 0.5 mg into the skin once a week., Disp: , Rfl:    ASSESSMENT AND PLAN: .      ICD-10-CM   1. Chronic systolic heart failure (HCC)  P49.77 EKG 12-Lead    carvedilol  (COREG ) 6.25 MG tablet    ECHOCARDIOGRAM COMPLETE    Cardiopulmonary exercise test    2. Non-ischemic cardiomyopathy (HCC)  I42.8 EKG 12-Lead    ECHOCARDIOGRAM COMPLETE    Cardiopulmonary exercise test    3. LBBB (left bundle branch block)  I44.7 EKG 12-Lead     Cardiopulmonary exercise test    4. OSA on CPAP  G47.33 EKG 12-Lead    Cardiopulmonary exercise test     Assessment and Plan    Heart Failure with mildly reduced Ejection Fraction (EF) EF improved from 25% to 46% on Carvedilol  and Entresto . Patient reports feeling weak and lightheaded, especially in the morning after taking medication. Blood pressure is low but stable.  Suspect left bundle branch block was related to underlying nonischemic cardiomyopathy.  Could be viral cardiomyopathy?. -Reduce Carvedilol  to 6.25mg  BID as her BP is very soft. -Space out morning doses of Carvedilol  and Entresto . -Order echocardiogram in 6 months to reassess EF. -Today she is not in any acute decompensated heart failure.  I have extensively discussed with her regarding indications for ICD again with the patient and reassured her.  Advised her that her long-term prognosis is now much improved since EF is >40%.  Sleep Apnea Managed by Dr. Buck. Patient reports feeling tired and sleeping more. Next appointment with Dr. Lurene is in January. -I will contact Dr. Buck to discuss potential need for adjustment in sleep apnea management.  General Health Maintenance Patient recently started [facility] for weight loss and potential cardiac benefits. Weight loss is not a primary goal due to normal BMI. -Continue Wegovy as tolerated, monitor for potential blood pressure changes and look out for hypotension. -Consider psychological support to help manage stress and adjustment to diagnosis.  Exercise Tolerance Patient reports decreased exercise tolerance and overall weakness. -Order cardiopulmonary stress test to assess heart and lung function during exercise. -Encourage patient to gradually increase physical activity as tolerated.  Follow-up Plan to reassess in 6 months, sooner if patient reports persistent or worsening symptoms.  Informed Consent   Shared Decision Making/Informed Consent The risks [chest pain,  shortness of breath, cardiac arrhythmias, dizziness, blood pressure fluctuations, myocardial infarction, stroke/transient ischemic attack, and life-threatening complications (estimated to be 1 in 10,000)], benefits (risk stratification, diagnosing coronary artery disease, treatment guidance) and alternatives of an exercise tolerance test were discussed in detail with Monica Adams and she agrees to proceed.  I spent a total of 45 minutes in review of her chart, review of consults from EP, review of MRI report and discussions and counseling and medication changes.      Signed,  Gordy Bergamo, MD, [facility] 06/28/2023, 6:14 PM [facility] [address] [place], [place] [address] Phone: [phone]. Fax:  [phone]

## 2023-06-28 NOTE — Patient Instructions (Addendum)
 Medication Instructions:  Your physician has recommended you make the following change in your medication: Decrease carvedilol  to 6.25 mg by mouth twice daily   *If you need a refill on your cardiac medications before your next appointment, please call your pharmacy*   Lab Work: none If you have labs (blood work) drawn today and your tests are completely normal, you will receive your results only by: MyChart Message (if you have MyChart) OR A paper copy in the mail If you have any lab test that is abnormal or we need to change your treatment, we will call you to review the results.   Testing/Procedures: Your physician has recommended that you have a cardiopulmonary stress test (CPX). CPX testing is a non-invasive measurement of heart and lung function. It replaces a traditional treadmill stress test. This type of test provides a tremendous amount of information that relates not only to your present condition but also for future outcomes. This test combines measurements of you ventilation, respiratory gas exchange in the lungs, electrocardiogram (EKG), blood pressure and physical response before, during, and following an exercise protocol.   Your physician has requested that you have an echocardiogram.To be done in 6 months  Echocardiography is a painless test that uses sound waves to create images of your heart. It provides your doctor with information about the size and shape of your heart and how well your heart's chambers and valves are working. This procedure takes approximately one hour. There are no restrictions for this procedure. Please do NOT wear cologne, perfume, aftershave, or lotions (deodorant is allowed). Please arrive 15 minutes prior to your appointment time.  Please note: We ask at that you not bring children with you during ultrasound (echo/ vascular) testing. Due to room size and safety concerns, children are not allowed in the ultrasound rooms during exams. Our front office  staff cannot provide observation of children in our lobby area while testing is being conducted. An adult accompanying a patient to their appointment will only be allowed in the ultrasound room at the discretion of the ultrasound technician under special circumstances. We apologize for any inconvenience. h   Follow-Up: At [facility], you and your health needs are our priority.  As part of our continuing mission to provide you with exceptional heart care, we have created designated Provider Care Teams.  These Care Teams include your primary Cardiologist (physician) and Advanced Practice Providers (APPs -  Physician Assistants and Nurse Practitioners) who all work together to provide you with the care you need, when you need it.  We recommend signing up for the patient portal called MyChart.  Sign up information is provided on this After Visit Summary.  MyChart is used to connect with patients for Virtual Visits (Telemedicine).  Patients are able to view lab/test results, encounter notes, upcoming appointments, etc.  Non-urgent messages can be sent to your provider as well.   To learn more about what you can do with MyChart, go to [email or web address].    Your next appointment:   6 month(s)  Provider:   Gordy Bergamo, MD     Other Instructions   You are scheduled for a Cardiopulmonary Exercise (CPX) Test as [facility] on: Date:      Time:   Expect to be in the lab for 2 hours. Please plan to arrive 30 minutes prior to your appointment. You may be asked to reschedule your test if you arrive 20 minutes or more after your scheduled appointment time.  Main  Campus address: [address] [place], [place] [address] You may arrive to the Main Entrance A or Entrance C (free valet parking is available at both). -Main Entrance A (on [address]) :proceed to admitting for check in -Entrance C (on [employer]): proceed to [employer] parking or under hospital deck parking using this code  _________  Check In: [facility] waiting room (1st floor)   General Instructions for the day of the test (Please follow all instructions from your physician): Refrain from ingesting a heavy meal, alcohol , or caffeine or using tobacco products within 2 hours of the test (DO NOT FAST for mare than 8 hours). You may have all other non-alcoholic, non -caffeinated beverage,a light snack (crackers,a piece of fruit, carrot sticks, toast bagel,etc) up to your appointment. Avoid significant exertion or exercise within 24 hours of your test. Be prepared to exercise and sweat. Your clothing should permit freedom of movement and include walking or running shoes. Women bring loose fitting short sleeved blouse.  This evaluation may be fatiguing and you may wish ti have someone accompany you to the assessment to drive you home afterward. Bring a list of your medications with you, including dosage and frequency you take the medications (  I.e.,once per day, twice per day, etc). Take all medications as prescribed, unless noted below or instructed to do so by your physician.  Please do not take the following medications prior to your CPX:  _________________________________________________  _________________________________________________  Brief description of the test: A brief lung test will be performed. This will involve you taking deep breaths and blowing hard and fast through your mouth. During these , a clip will be on your nose and you will be breathing through a breathing device.   For the exercise portion of the test you will be walking on a treadmill, or riding a stationary bike, to your maximal effor or until symptoms such as chest pain, shortness of breath, leg pain or dizziness limit your exercise. You will be breathing in and out of a breathing device through your mouth (a clip will be on your nose again). Your heart rate, ECG, blood pressure, oxygen saturations, breathing rate and  depth, amount of oxygen you consume and amount of carbon dioxide you produce will be measured and monitored throughout the exercise test.  If you need to cancel or reschedule your appointment please call [phone] If you have further questions please call your physician or Josette Pesa, MS, ACSM-RCEP at [phone]

## 2023-06-30 DIAGNOSIS — N390 Urinary tract infection, site not specified: Secondary | ICD-10-CM | POA: Diagnosis not present

## 2023-07-02 DIAGNOSIS — H1789 Other corneal scars and opacities: Secondary | ICD-10-CM | POA: Diagnosis not present

## 2023-07-02 DIAGNOSIS — H52203 Unspecified astigmatism, bilateral: Secondary | ICD-10-CM | POA: Diagnosis not present

## 2023-07-02 DIAGNOSIS — H5203 Hypermetropia, bilateral: Secondary | ICD-10-CM | POA: Diagnosis not present

## 2023-07-12 DIAGNOSIS — M810 Age-related osteoporosis without current pathological fracture: Secondary | ICD-10-CM | POA: Diagnosis not present

## 2023-07-12 DIAGNOSIS — E785 Hyperlipidemia, unspecified: Secondary | ICD-10-CM | POA: Diagnosis not present

## 2023-07-19 NOTE — Progress Notes (Deleted)
 PATIENT: Monica Adams DOB: [birth year]  REASON FOR VISIT: follow up HISTORY FROM: patient  Virtual Visit via Telephone Note  I connected with Monica Adams on 07/19/23 at  8:00 AM EST by telephone and verified that I am speaking with the correct person using two identifiers.   I discussed the limitations, risks, security and privacy concerns of performing an evaluation and management service by telephone and the availability of in person appointments. I also discussed with the patient that there may be a patient responsible charge related to this service. The patient expressed understanding and agreed to proceed.   History of Present Illness:  07/19/23 ALL: Monica Adams is a 63 y.o. female here today for follow up for OSA on CPAP.   History (copied from Dr Teofilo Pod previous note)  Monica Adams is a 63 year old right-handed woman with an underlying medical history of chronic systolic congestive heart failure, left bundle branch block, hyperlipidemia, osteoporosis, nonischemic cardiomyopathy, and borderline overweight state, who presents for follow-up consultation of her obstructive sleep apnea after interim testing and starting AutoPap therapy.  The patient is unaccompanied today.  I first met her at the request of her cardiologist on 03/13/2022, at which time she reported snoring and sleep disruption as well as nocturia.  She was advised to proceed with a sleep study. Her home sleep test from 04/18/22 showed moderate obstructive sleep apnea with a total AHI of 26.2/hour and O2 nadir of 81%.  Intermittent mild to moderate snoring was detected.  She was advised to proceed with home AutoPap therapy.  Her set up date was 05/10/2022.  She has a ResMed air sense 10 AutoSet machine, DME company is [facility].    Today, 07/18/2022: I reviewed her AutoPap compliance data from 06/17/2022 through 07/16/2022, which is a total of 30 days, during which time she used her machine every night with  percent use days greater than 4 hours at 77%, indicating adequate compliance with an average usage of 5 hours and 23 minutes, residual AHI at goal at 4.2/h, 95th percentile of pressure at 9 cm with a range of 5 to 12 cm with EPR of 3.  Leak acceptable but on the higher side with the 95th percentile at 23.8 L/min.  She reports doing fairly well, still adjusting to treatment.  She has some sleep disruption from restless sleep but also struggling at times to keep her mouth closed, wakes up sometimes with really dry mouth, had to switch from a fullface mask to a nasal mask because of discomfort with a fullface mask.  She also has a puppy that requires attention at times at night.  She is very motivated to continue with treatment.  She had some medication adjustment for her heart medicine.  She had a recent checkup with her cardiologist last month.  I reviewed the office visit note from 06/08/2022.   Observations/Objective:  Generalized: Well developed, in no acute distress  Mentation: Alert oriented to time, place, history taking. Follows all commands speech and language fluent   Assessment and Plan:  63 y.o. year old female  has a past medical history of Hyperlipidemia, LBBB (left bundle branch block), and Osteoporosis. here with  No diagnosis found.  Ajwa is doing well on autoPAP therapy. Compliance report shows . She was encouraged to continue using CPAP nightly for at least 4 hours. Healthy lifestyle habits encouraged. She will follow up in 1 year, sooner if needed.   No orders of the defined types were placed in  this encounter.   No orders of the defined types were placed in this encounter.    Follow Up Instructions:  I discussed the assessment and treatment plan with the patient. The patient was provided an opportunity to ask questions and all were answered. The patient agreed with the plan and demonstrated an understanding of the instructions.   The patient was advised to call back  or seek an in-person evaluation if the symptoms worsen or if the condition fails to improve as anticipated.  I provided *** minutes of non-face-to-face time during this encounter. Patient located at their place of residence during Mychart visit. Provider is in the office.    Shawnie Dapper, NP

## 2023-07-19 NOTE — Patient Instructions (Incomplete)

## 2023-07-20 DIAGNOSIS — R82998 Other abnormal findings in urine: Secondary | ICD-10-CM | POA: Diagnosis not present

## 2023-07-20 DIAGNOSIS — I2510 Atherosclerotic heart disease of native coronary artery without angina pectoris: Secondary | ICD-10-CM | POA: Diagnosis not present

## 2023-07-20 DIAGNOSIS — Z23 Encounter for immunization: Secondary | ICD-10-CM | POA: Diagnosis not present

## 2023-07-20 DIAGNOSIS — Z0000 Encounter for general adult medical examination without abnormal findings: Secondary | ICD-10-CM | POA: Diagnosis not present

## 2023-07-23 NOTE — Progress Notes (Deleted)
 Marland Kitchen

## 2023-07-24 ENCOUNTER — Telehealth: Payer: BC Managed Care – PPO | Admitting: Family Medicine

## 2023-07-24 DIAGNOSIS — G4733 Obstructive sleep apnea (adult) (pediatric): Secondary | ICD-10-CM

## 2023-07-25 ENCOUNTER — Ambulatory Visit: Payer: BC Managed Care – PPO | Admitting: Cardiology

## 2023-07-26 NOTE — Progress Notes (Signed)
PATIENT: Monica Adams DOB: January 24, 1961  REASON FOR VISIT: follow up HISTORY FROM: patient  Virtual Visit via Telephone Note  I connected with Monica Adams on 07/27/23 at  8:30 AM EST by telephone and verified that I am speaking with the correct person using two identifiers.   I discussed the limitations, risks, security and privacy concerns of performing an evaluation and management service by telephone and the availability of in person appointments. I also discussed with the patient that there may be a patient responsible charge related to this service. The patient expressed understanding and agreed to proceed.   History of Present Illness:  07/27/23 ALL: Monica Adams is a 63 y.o. female here today for follow up for OSA on CPAP. She continues to do well on therapy. She is using CPAP nightly for about 6 hours, on average. She denies concerns with machine. She has had difficulty ordering the correct mask online with DME. She has been getting masks through [employer]. Using N30 small pillow. She has been sick with respiratory illness recently and contributed to lower compliance. Otherwise, doing well.     History (copied from Dr Teofilo Pod previous note)  Monica Adams is a 63 year old right-handed woman with an underlying medical history of chronic systolic congestive heart failure, left bundle branch block, hyperlipidemia, osteoporosis, nonischemic cardiomyopathy, and borderline overweight state, who presents for follow-up consultation of her obstructive sleep apnea after interim testing and starting AutoPap therapy.  The patient is unaccompanied today.  I first met her at the request of her cardiologist on 03/13/2022, at which time she reported snoring and sleep disruption as well as nocturia.  She was advised to proceed with a sleep study. Her home sleep test from 04/18/22 showed moderate obstructive sleep apnea with a total AHI of 26.2/hour and O2 nadir of 81%.  Intermittent mild to  moderate snoring was detected.  She was advised to proceed with home AutoPap therapy.  Her set up date was 05/10/2022.  She has a ResMed air sense 10 AutoSet machine, DME company is [facility].    Today, 07/18/2022: I reviewed her AutoPap compliance data from 06/17/2022 through 07/16/2022, which is a total of 30 days, during which time she used her machine every night with percent use days greater than 4 hours at 77%, indicating adequate compliance with an average usage of 5 hours and 23 minutes, residual AHI at goal at 4.2/h, 95th percentile of pressure at 9 cm with a range of 5 to 12 cm with EPR of 3.  Leak acceptable but on the higher side with the 95th percentile at 23.8 L/min.  She reports doing fairly well, still adjusting to treatment.  She has some sleep disruption from restless sleep but also struggling at times to keep her mouth closed, wakes up sometimes with really dry mouth, had to switch from a fullface mask to a nasal mask because of discomfort with a fullface mask.  She also has a puppy that requires attention at times at night.  She is very motivated to continue with treatment.  She had some medication adjustment for her heart medicine.  She had a recent checkup with her cardiologist last month.  I reviewed the office visit note from 06/08/2022.   Observations/Objective:  Generalized: Well developed, in no acute distress  Mentation: Alert oriented to time, place, history taking. Follows all commands speech and language fluent   Assessment and Plan:  63 y.o. year old female  has a past medical history of Hyperlipidemia,  LBBB (left bundle branch block), and Osteoporosis. here with    ICD-10-CM   1. OSA on CPAP  G47.33 For home use only DME continuous positive airway pressure (CPAP)      [name] is doing well on autoPAP therapy. Compliance report shows . She was encouraged to continue using CPAP nightly for at least 4 hours. Healthy lifestyle habits encouraged. She will follow up in 1  year, sooner if needed.   Orders Placed This Encounter  Procedures   For home use only DME continuous positive airway pressure (CPAP)    Heated Humidity with all supplies as needed, using N30 pillow in small    Length of Need:   Lifetime    Patient has OSA or probable OSA:   Yes    Is the patient currently using CPAP in the home:   Yes    Settings:   Other see comments    CPAP supplies needed:   Mask, headgear, cushions, filters, heated tubing and water chamber    No orders of the defined types were placed in this encounter.    Follow Up Instructions:  I discussed the assessment and treatment plan with the patient. The patient was provided an opportunity to ask questions and all were answered. The patient agreed with the plan and demonstrated an understanding of the instructions.   The patient was advised to call back or seek an in-person evaluation if the symptoms worsen or if the condition fails to improve as anticipated.  I provided 15 minutes of non-face-to-face time during this encounter. Patient located at their place of residence during Mychart visit. Provider is in the office.    Shawnie Dapper, NP

## 2023-07-27 ENCOUNTER — Encounter: Payer: Self-pay | Admitting: Family Medicine

## 2023-07-27 ENCOUNTER — Telehealth (INDEPENDENT_AMBULATORY_CARE_PROVIDER_SITE_OTHER): Payer: BC Managed Care – PPO | Admitting: Family Medicine

## 2023-07-27 ENCOUNTER — Telehealth: Payer: Self-pay

## 2023-07-27 ENCOUNTER — Encounter: Payer: Self-pay | Admitting: *Deleted

## 2023-07-27 DIAGNOSIS — G4733 Obstructive sleep apnea (adult) (pediatric): Secondary | ICD-10-CM | POA: Diagnosis not present

## 2023-07-27 NOTE — Progress Notes (Signed)
 duplicate

## 2023-07-27 NOTE — Progress Notes (Signed)
 Monica Adams

## 2023-07-27 NOTE — Telephone Encounter (Signed)
Please see MyChart message from today 07/27/2023

## 2023-07-27 NOTE — Progress Notes (Signed)
Community message sent to [facility] that order placed.

## 2023-07-27 NOTE — Patient Instructions (Signed)
Please continue using your CPAP regularly. While your insurance requires that you use CPAP at least 4 hours each night on 70% of the nights, I recommend, that you not skip any nights and use it throughout the night if you can. Getting used to CPAP and staying with the treatment long term does take time and patience and discipline. Untreated obstructive sleep apnea when it is moderate to severe can have an adverse impact on cardiovascular health and raise her risk for heart disease, arrhythmias, hypertension, congestive heart failure, stroke and diabetes. Untreated obstructive sleep apnea causes sleep disruption, nonrestorative sleep, and sleep deprivation. This can have an impact on your day to day functioning and cause daytime sleepiness and impairment of cognitive function, memory loss, mood disturbance, and problems focussing. Using CPAP regularly can improve these symptoms.  We will update supply orders, today. DME:  [facility] [phone]  Follow up in 1 year

## 2023-07-31 DIAGNOSIS — G4733 Obstructive sleep apnea (adult) (pediatric): Secondary | ICD-10-CM | POA: Diagnosis not present

## 2023-08-14 DIAGNOSIS — Z1211 Encounter for screening for malignant neoplasm of colon: Secondary | ICD-10-CM | POA: Diagnosis not present

## 2023-08-31 ENCOUNTER — Ambulatory Visit: Payer: BC Managed Care – PPO | Admitting: Cardiology

## 2023-09-18 DIAGNOSIS — G4733 Obstructive sleep apnea (adult) (pediatric): Secondary | ICD-10-CM | POA: Diagnosis not present

## 2023-09-26 DIAGNOSIS — D1801 Hemangioma of skin and subcutaneous tissue: Secondary | ICD-10-CM | POA: Diagnosis not present

## 2023-09-26 DIAGNOSIS — D1721 Benign lipomatous neoplasm of skin and subcutaneous tissue of right arm: Secondary | ICD-10-CM | POA: Diagnosis not present

## 2023-09-26 DIAGNOSIS — L570 Actinic keratosis: Secondary | ICD-10-CM | POA: Diagnosis not present

## 2023-09-26 DIAGNOSIS — D225 Melanocytic nevi of trunk: Secondary | ICD-10-CM | POA: Diagnosis not present

## 2023-09-26 DIAGNOSIS — D2271 Melanocytic nevi of right lower limb, including hip: Secondary | ICD-10-CM | POA: Diagnosis not present

## 2023-09-26 DIAGNOSIS — D2262 Melanocytic nevi of left upper limb, including shoulder: Secondary | ICD-10-CM | POA: Diagnosis not present

## 2023-09-26 DIAGNOSIS — L821 Other seborrheic keratosis: Secondary | ICD-10-CM | POA: Diagnosis not present

## 2023-09-26 DIAGNOSIS — D2261 Melanocytic nevi of right upper limb, including shoulder: Secondary | ICD-10-CM | POA: Diagnosis not present

## 2023-09-26 DIAGNOSIS — L814 Other melanin hyperpigmentation: Secondary | ICD-10-CM | POA: Diagnosis not present

## 2023-10-18 DIAGNOSIS — I5022 Chronic systolic (congestive) heart failure: Secondary | ICD-10-CM | POA: Diagnosis not present

## 2023-10-18 DIAGNOSIS — I2510 Atherosclerotic heart disease of native coronary artery without angina pectoris: Secondary | ICD-10-CM | POA: Diagnosis not present

## 2023-10-18 DIAGNOSIS — G4733 Obstructive sleep apnea (adult) (pediatric): Secondary | ICD-10-CM | POA: Diagnosis not present

## 2023-12-07 ENCOUNTER — Telehealth: Payer: Self-pay | Admitting: *Deleted

## 2023-12-07 DIAGNOSIS — D1721 Benign lipomatous neoplasm of skin and subcutaneous tissue of right arm: Secondary | ICD-10-CM | POA: Diagnosis not present

## 2023-12-07 NOTE — Telephone Encounter (Signed)
 Pt has been scheduled tele preop appt 12/13/23. Surgeon waiting on clearance before scheduling procedure.   Med rec and consent are done.

## 2023-12-07 NOTE — Telephone Encounter (Signed)
   Pre-operative Risk Assessment    Patient Name: Monica Adams  DOB: [birth year] MRN: [id number]   Date of last office visit: 06/28/23 DR. Berry Bristol Date of next office visit: NONE   Request for Surgical Clearance    Procedure:  LIPOMA SURGERY  Date of Surgery:  Clearance TBD                                Surgeon:  DR. PAUL STECHSCHULTE Surgeon's Group or Practice Name:  [facility] Phone number:  [phone] Fax number:  [phone] HOLLIE TROY, CMA   Type of Clearance Requested:   - Medical ; NONE INDICATED TO BE HELD   Type of Anesthesia:  MAC   Additional requests/questions:    Princ[place] Broom   12/07/2023, 9:38 AM

## 2023-12-07 NOTE — Telephone Encounter (Signed)
 Pt has been scheduled tele preop appt 12/13/23. Surgeon waiting on clearance before scheduling procedure.   Med rec and consent are done.     Patient Consent for Virtual Visit        Monica Adams has provided verbal consent on 12/07/2023 for a virtual visit (video or telephone).   CONSENT FOR VIRTUAL VISIT FOR:  Monica Adams  By participating in this virtual visit I agree to the following:  I hereby voluntarily request, consent and authorize [facility] and its employed or contracted physicians, physician assistants, nurse practitioners or other licensed health care professionals (the Practitioner), to provide me with telemedicine health care services (the "Services) as deemed necessary by the treating Practitioner. I acknowledge and consent to receive the Services by the Practitioner via telemedicine. I understand that the telemedicine visit will involve communicating with the Practitioner through live audiovisual communication technology and the disclosure of certain medical information by electronic transmission. I acknowledge that I have been given the opportunity to request an in-person assessment or other available alternative prior to the telemedicine visit and am voluntarily participating in the telemedicine visit.  I understand that I have the right to withhold or withdraw my consent to the use of telemedicine in the course of my care at any time, without affecting my right to future care or treatment, and that the Practitioner or I may terminate the telemedicine visit at any time. I understand that I have the right to inspect all information obtained and/or recorded in the course of the telemedicine visit and may receive copies of available information for a reasonable fee.  I understand that some of the potential risks of receiving the Services via telemedicine include:  Delay or interruption in medical evaluation due to technological equipment failure or  disruption; Information transmitted may not be sufficient (e.g. poor resolution of images) to allow for appropriate medical decision making by the Practitioner; and/or  In rare instances, security protocols could fail, causing a breach of personal health information.  Furthermore, I acknowledge that it is my responsibility to provide information about my medical history, conditions and care that is complete and accurate to the best of my ability. I acknowledge that Practitioner's advice, recommendations, and/or decision may be based on factors not within their control, such as incomplete or inaccurate data provided by me or distortions of diagnostic images or specimens that may result from electronic transmissions. I understand that the practice of medicine is not an exact science and that Practitioner makes no warranties or guarantees regarding treatment outcomes. I acknowledge that a copy of this consent can be made available to me via my patient portal [facility] MyChart), or I can request a printed copy by calling the office of [facility].    I understand that my insurance will be billed for this visit.   I have read or had this consent read to me. I understand the contents of this consent, which adequately explains the benefits and risks of the Services being provided via telemedicine.  I have been provided ample opportunity to ask questions regarding this consent and the Services and have had my questions answered to my satisfaction. I give my informed consent for the services to be provided through the use of telemedicine in my medical care

## 2023-12-12 NOTE — Progress Notes (Signed)
 Virtual Visit via Telephone Note   Because of Monica Adams co-morbid illnesses, she is at least at moderate risk for complications without adequate follow up.  This format is felt to be most appropriate for this patient at this time.  Due to technical limitations with video connection (technology), today's appointment will be conducted as an audio only telehealth visit, and Monica Adams verbally agreed to proceed in this manner.   All issues noted in this document were discussed and addressed.  No physical exam could be performed with this format.  Evaluation Performed:  Preoperative cardiovascular risk assessment _____________   Date:  12/12/2023   Patient ID:  Monica Adams, DOB Nov 08, 1960, MRN [id number] Patient Location:  Home Provider location:   Office  Primary Care Provider:  Shayne Anes, MD Primary Cardiologist:  Gordy Bergamo, MD  Chief Complaint / Patient Profile   63 y.o. y/o female with a h/o chronic systolic CHF, LBBB, hyperlipidemia who is pending lipoma surgery and presents today for telephonic preoperative cardiovascular risk assessment.  History of Present Illness    Monica Adams is a 64 y.o. female who presents via audio/video conferencing for a telehealth visit today.  Pt was last seen in cardiology clinic on 06/28/2023 by Dr Bergamo.  At that time Monica Adams was doing well but noted fatigue.  Her carvedilol  was reduced to 6.25 mg twice daily.  The patient is now pending procedure as outlined above. Since her last visit, she continues to be stable from a cardiac standpoint.  Today she denies chest pain, shortness of breath, lower extremity edema, fatigue, palpitations, melena, hematuria, hemoptysis, diaphoresis, weakness, presyncope, syncope, orthopnea, and PND.   Past Medical History    Past Medical History:  Diagnosis Date   Hyperlipidemia    LBBB (left bundle branch block)    Osteoporosis    Past Surgical History:  Procedure Laterality Date    kidney stone removal     RIGHT/LEFT HEART CATH AND CORONARY ANGIOGRAPHY N/A 01/10/2022   Procedure: RIGHT/LEFT HEART CATH AND CORONARY ANGIOGRAPHY;  Surgeon: Bergamo Gordy, MD;  Location: [facility];  Service: Cardiovascular;  Laterality: N/A;    Allergies  No Known Allergies  Home Medications    Prior to Admission medications   Medication Sig Start Date End Date Taking? Authorizing Provider  carvedilol  (COREG ) 6.25 MG tablet Take 1 tablet (6.25 mg total) by mouth 2 (two) times daily. 06/28/23   Bergamo Gordy, MD  ENTRESTO  97-103 MG TAKE 1 TABLET BY MOUTH TWICE DAILY 03/30/23   Cindie Ole DASEN, MD  ibandronate (BONIVA) 150 MG tablet Take 150 mg by mouth every 30 (thirty) days. 04/19/21   [provider]  Multiple Vitamins-Minerals (ONE A DAY WOMEN 50 PLUS PO) Take 1 tablet by mouth daily.    [provider]  nitroGLYCERIN  (NITROSTAT ) 0.4 MG SL tablet Place 1 tablet (0.4 mg total) under the tongue every 5 (five) minutes as needed for up to 4 doses for chest pain. 11/08/21   Vonn Hadassah LABOR, PA-C  REPATHA SURECLICK 140 MG/ML SOAJ Inject 140 mg into the skin every 14 (fourteen) days. 05/05/21   [provider]  rosuvastatin (CRESTOR) 20 MG tablet Take 20 mg by mouth daily. 04/10/21   [provider]  WEGOVY 0.5 MG/0.5ML SOAJ Inject 0.5 mg into the skin once a week. Patient taking differently: Inject 1 mg into the skin once a week. PER PT DOSE INCREASED    [provider]  Physical Exam    Vital Signs:  Monica Adams does not have vital signs available for review today.  Given telephonic nature of communication, physical exam is limited. AAOx3. NAD. Normal affect.  Speech and respirations are unlabored.  Accessory Clinical Findings    None  Assessment & Plan    1.  Preoperative Cardiovascular Risk Assessment: Procedure:  LIPOMA SURGERY   Date of Surgery:  Clearance TBD                                  Surgeon:  DR. PAUL  STECHSCHULTE Surgeon's Group or Practice Name:  [facility] number:  [phone] Fax number:  [phone]       Primary Cardiologist: Gordy Bergamo, MD  Chart reviewed as part of pre-operative protocol coverage. Given past medical history and time since last visit, based on ACC/AHA guidelines, Monica Adams would be at acceptable risk for the planned procedure without further cardiovascular testing.   Her RCRI is moderate risk, 6.6% risk of major cardiac event.  She is able to complete greater than 4 METS of physical activity.  Patient was advised that if she develops new symptoms prior to surgery to contact our office to arrange a follow-up appointment.  He verbalized understanding.  Patient currently not taking antiplatelet or anticoagulant therapy.  I will route this recommendation to the requesting party via Epic fax function and remove from pre-op pool.       Time:   Today, I have spent 7 minutes with the patient with telehealth technology discussing medical history, symptoms, and management plan.  I spent 10 minutes reviewing patient's past cardiac history and cardiac medications.    Monica CHRISTELLA Beauvais, NP  12/12/2023, 1:16 PM

## 2023-12-13 ENCOUNTER — Ambulatory Visit: Attending: Cardiovascular Disease

## 2023-12-13 DIAGNOSIS — Z01810 Encounter for preprocedural cardiovascular examination: Secondary | ICD-10-CM | POA: Diagnosis not present

## 2023-12-25 ENCOUNTER — Telehealth (HOSPITAL_COMMUNITY): Payer: Self-pay

## 2023-12-25 NOTE — Telephone Encounter (Signed)
 front office personnel left pt a message to schedule a CPX test ordered by Dr. Ladona (from [place]). Front office personnel instructed pt to call the [facility] office back to get scheduled.

## 2024-01-01 ENCOUNTER — Ambulatory Visit (HOSPITAL_COMMUNITY)
Admission: RE | Admit: 2024-01-01 | Discharge: 2024-01-01 | Disposition: A | Discharge status: Home / Self Care | Payer: BC Managed Care – PPO | Source: Ambulatory Visit | Attending: Cardiology | Admitting: Cardiology

## 2024-01-01 DIAGNOSIS — I5022 Chronic systolic (congestive) heart failure: Secondary | ICD-10-CM | POA: Diagnosis not present

## 2024-01-01 DIAGNOSIS — I428 Other cardiomyopathies: Secondary | ICD-10-CM | POA: Insufficient documentation

## 2024-01-01 LAB — ECHOCARDIOGRAM COMPLETE
Area-P 1/2: 2.95 cm2
S' Lateral: 3.5 cm

## 2024-01-02 ENCOUNTER — Ambulatory Visit: Payer: Self-pay | Admitting: Cardiology

## 2024-01-02 NOTE — Progress Notes (Signed)
 Your heart function improved from 30-35% to the present 45 to 50% that is very close to normal.  I am very pleased with the results, continue present medical therapy.

## 2024-01-04 ENCOUNTER — Ambulatory Visit (HOSPITAL_COMMUNITY): Payer: Self-pay | Attending: Cardiology

## 2024-01-04 DIAGNOSIS — Z87891 Personal history of nicotine dependence: Secondary | ICD-10-CM | POA: Diagnosis not present

## 2024-01-04 DIAGNOSIS — I5022 Chronic systolic (congestive) heart failure: Secondary | ICD-10-CM | POA: Diagnosis not present

## 2024-01-04 DIAGNOSIS — I447 Left bundle-branch block, unspecified: Secondary | ICD-10-CM | POA: Insufficient documentation

## 2024-01-04 DIAGNOSIS — E785 Hyperlipidemia, unspecified: Secondary | ICD-10-CM | POA: Insufficient documentation

## 2024-01-04 DIAGNOSIS — I428 Other cardiomyopathies: Secondary | ICD-10-CM | POA: Diagnosis not present

## 2024-01-04 DIAGNOSIS — G4733 Obstructive sleep apnea (adult) (pediatric): Secondary | ICD-10-CM | POA: Insufficient documentation

## 2024-01-04 DIAGNOSIS — M810 Age-related osteoporosis without current pathological fracture: Secondary | ICD-10-CM | POA: Diagnosis not present

## 2024-01-04 DIAGNOSIS — I5020 Unspecified systolic (congestive) heart failure: Secondary | ICD-10-CM | POA: Diagnosis not present

## 2024-01-07 ENCOUNTER — Telehealth: Payer: Self-pay | Admitting: *Deleted

## 2024-01-07 NOTE — Telephone Encounter (Signed)
   Pre-operative Risk Assessment    Patient Name: Monica Adams  DOB: [birth year] MRN: [id number]   Date of last office visit: 06/28/23 DR. LADONA Date of next office visit: NONE   Request for Surgical Clearance    Procedure:  B/L BREAST IMPLANT EXCHANGE  Date of Surgery:  Clearance TBD                                Surgeon:  DR. YVONNE Surgeon's Group or Practice Name:  [facility] Phone number:  [phone] Fax number:  [phone]   Type of Clearance Requested:   - Medical ; NONE INDICATED ON FORM TO BE HELD   Type of Anesthesia:  CHOICE   Additional requests/questions:    Signed, Grenda Lora   01/07/2024, 12:32 PM

## 2024-01-14 NOTE — Progress Notes (Signed)
 Very normal cardiopulmonary stress test

## 2024-01-15 NOTE — Telephone Encounter (Signed)
   Primary Cardiologist: Gordy Bergamo, MD  Chart reviewed as part of pre-operative protocol coverage. Given past medical history and time since last visit, based on ACC/AHA guidelines, Monica Adams would be at acceptable risk for the planned procedure without further cardiovascular testing.   Patient should contact our office if she is having new symptoms that are concerning from a cardiac perspective to arrange a follow-up appointment.    I will route this recommendation to the requesting party via Epic fax function and remove from pre-op pool.  Please call with questions.  Rosaline EMERSON Bane, NP-C  01/15/2024, 3:39 PM [address] [place], [place] [address] Office [phone] Fax [phone]

## 2024-01-18 NOTE — Telephone Encounter (Signed)
 Patient will need a new EKG for pre-op at the last EKG was on 06/28/2023.  Please have patient come by and then let them know to send to surgeon. Thank you!

## 2024-01-18 NOTE — Telephone Encounter (Signed)
 Calling to say they need a copy of patient EKG send to their office. Please advise

## 2024-01-18 NOTE — Telephone Encounter (Signed)
 Patient will be coming into the office on August 4th for a nurse visit for an EKG for pre-op clearance.

## 2024-01-22 DIAGNOSIS — D1721 Benign lipomatous neoplasm of skin and subcutaneous tissue of right arm: Secondary | ICD-10-CM | POA: Diagnosis not present

## 2024-01-24 DIAGNOSIS — Z01812 Encounter for preprocedural laboratory examination: Secondary | ICD-10-CM | POA: Diagnosis not present

## 2024-01-24 DIAGNOSIS — Z9884 Bariatric surgery status: Secondary | ICD-10-CM | POA: Diagnosis not present

## 2024-01-24 DIAGNOSIS — Z1231 Encounter for screening mammogram for malignant neoplasm of breast: Secondary | ICD-10-CM | POA: Diagnosis not present

## 2024-01-28 ENCOUNTER — Ambulatory Visit: Attending: Cardiovascular Disease | Admitting: *Deleted

## 2024-01-28 VITALS — Ht 66.0 in | Wt 144.4 lb

## 2024-01-28 DIAGNOSIS — Z6823 Body mass index (BMI) 23.0-23.9, adult: Secondary | ICD-10-CM | POA: Diagnosis not present

## 2024-01-28 DIAGNOSIS — Z124 Encounter for screening for malignant neoplasm of cervix: Secondary | ICD-10-CM | POA: Diagnosis not present

## 2024-01-28 DIAGNOSIS — Z01818 Encounter for other preprocedural examination: Secondary | ICD-10-CM | POA: Diagnosis not present

## 2024-01-28 DIAGNOSIS — Z1151 Encounter for screening for human papillomavirus (HPV): Secondary | ICD-10-CM | POA: Diagnosis not present

## 2024-01-28 DIAGNOSIS — I5022 Chronic systolic (congestive) heart failure: Secondary | ICD-10-CM

## 2024-01-28 DIAGNOSIS — Z01419 Encounter for gynecological examination (general) (routine) without abnormal findings: Secondary | ICD-10-CM | POA: Diagnosis not present

## 2024-01-28 NOTE — Patient Instructions (Addendum)
   Nurse Visit   Date of Encounter: 01/28/2024 ID: MIKAEL DEBELL, DOB [birth year], MRN [id number]  PCP:  Shayne Anes, MD   [facility] Providers Cardiologist:  Gordy Bergamo, MD Electrophysiologist:  OLE ONEIDA HOLTS, MD      Visit Details   VS:  Ht 5' 6 (1.676 m)   Wt 144 lb 6.4 oz (65.5 kg)   BMI 23.31 kg/m  , BMI Body mass index is 23.31 kg/m.  Wt Readings from Last 3 Encounters:  01/28/24 144 lb 6.4 oz (65.5 kg)  06/28/23 156 lb 6.4 oz (70.9 kg)  02/14/23 155 lb (70.3 kg)     Reason for visit: pre op clearace Performed today: EKG, consulted DOD, Dr. Verlin Changes (medications, testing, etc.) : n/a Length of Visit: 10 minutes    Medications Adjustments/Labs and Tests Ordered: Orders Placed This Encounter  Procedures   EKG 12-Lead   No orders of the defined types were placed in this encounter.    Bonney Trudy Frankey JONELLE, RN  01/28/2024 2:07 PM

## 2024-01-29 NOTE — Telephone Encounter (Signed)
 2nd request received. Will route over the EKG to the surgeons office.

## 2024-02-11 ENCOUNTER — Telehealth: Payer: Self-pay | Admitting: Cardiology

## 2024-02-11 NOTE — Telephone Encounter (Signed)
 I was able to call the patient and let her know that the medication she was concerned about which was Sacubitril -valsartan  is the same drug as Entresto . I explained the difference between brand names and generic names. She was thankful for the call back and she said her question was answered.

## 2024-02-11 NOTE — Telephone Encounter (Signed)
 Pt c/o medication issue:  1. Name of Medication: ENTRESTO  97-103 MG   2. How are you currently taking this medication (dosage and times per day)?   3. Are you having a reaction (difficulty breathing--STAT)?   4. What is your medication issue? Patient stated that she has been taking Entresto  all this time. She picked up her medications today and stated that the Entresto  was subbed for Sacubitril -valsartan . She is worried about this switch and would like to know why it is this new medication vs her normal Entresto .   She states that if this can be corrected and Entresto  be sent in to the pharmacy - she would like it sent to her new pharmacy: [id number] - [place], [place] - [address]

## 2024-03-04 DIAGNOSIS — Z23 Encounter for immunization: Secondary | ICD-10-CM | POA: Diagnosis not present

## 2024-03-04 DIAGNOSIS — I5022 Chronic systolic (congestive) heart failure: Secondary | ICD-10-CM | POA: Diagnosis not present

## 2024-03-04 DIAGNOSIS — Z8249 Family history of ischemic heart disease and other diseases of the circulatory system: Secondary | ICD-10-CM | POA: Diagnosis not present

## 2024-03-04 DIAGNOSIS — K649 Unspecified hemorrhoids: Secondary | ICD-10-CM | POA: Diagnosis not present

## 2024-03-04 DIAGNOSIS — F439 Reaction to severe stress, unspecified: Secondary | ICD-10-CM | POA: Diagnosis not present

## 2024-03-04 DIAGNOSIS — M810 Age-related osteoporosis without current pathological fracture: Secondary | ICD-10-CM | POA: Diagnosis not present

## 2024-03-04 DIAGNOSIS — M25562 Pain in left knee: Secondary | ICD-10-CM | POA: Diagnosis not present

## 2024-03-04 DIAGNOSIS — I447 Left bundle-branch block, unspecified: Secondary | ICD-10-CM | POA: Diagnosis not present

## 2024-03-04 DIAGNOSIS — E785 Hyperlipidemia, unspecified: Secondary | ICD-10-CM | POA: Diagnosis not present

## 2024-03-04 DIAGNOSIS — I2510 Atherosclerotic heart disease of native coronary artery without angina pectoris: Secondary | ICD-10-CM | POA: Diagnosis not present

## 2024-03-04 DIAGNOSIS — G4733 Obstructive sleep apnea (adult) (pediatric): Secondary | ICD-10-CM | POA: Diagnosis not present

## 2024-03-04 DIAGNOSIS — I429 Cardiomyopathy, unspecified: Secondary | ICD-10-CM | POA: Diagnosis not present

## 2024-03-25 DIAGNOSIS — R300 Dysuria: Secondary | ICD-10-CM | POA: Diagnosis not present

## 2024-04-11 ENCOUNTER — Other Ambulatory Visit: Payer: Self-pay | Admitting: Cardiology

## 2024-04-11 DIAGNOSIS — I5022 Chronic systolic (congestive) heart failure: Secondary | ICD-10-CM

## 2024-07-14 ENCOUNTER — Telehealth: Payer: Self-pay | Admitting: Cardiology

## 2024-07-14 DIAGNOSIS — I5022 Chronic systolic (congestive) heart failure: Secondary | ICD-10-CM

## 2024-07-14 NOTE — Telephone Encounter (Signed)
" °*  STAT* If patient is at the pharmacy, call can be transferred to refill team.   1. Which medications need to be refilled? (please list name of each medication and dose if known) carvedilol  (COREG ) 6.25 MG tablet    2. Would you like to learn more about the convenience, safety, & potential cost savings by using the [facility]?   3. Are you open to using the [facility] (Type [facility].  ).   4. Which pharmacy/location (including street and city if local pharmacy) is medication to be sent to?  [id number] - [place], [place] - [address]     5. Do they need a 30 day or 90 day supply? 90 day  "

## 2024-07-17 MED ORDER — CARVEDILOL 6.25 MG PO TABS: 6.2500 mg | ORAL_TABLET | Freq: Two times a day (BID) | ORAL | 0 refills | Status: AC

## 2024-07-17 NOTE — Telephone Encounter (Signed)
 Pt scheduled 09/15/24.  Refill sent.

## 2024-07-31 ENCOUNTER — Telehealth: Payer: BC Managed Care – PPO | Admitting: Family Medicine

## 2024-08-12 ENCOUNTER — Telehealth: Admitting: Family Medicine

## 2024-09-15 ENCOUNTER — Ambulatory Visit: Admitting: Cardiology

## 2024-09-16 ENCOUNTER — Telehealth: Admitting: Family Medicine
